# Patient Record
Sex: Female | Born: 1955 | Race: White | Hispanic: No | Smoking: Former smoker
Health system: Southern US, Community
[De-identification: ages and names within clinical notes are randomized; demographics above are authoritative.]

## PROBLEM LIST (undated history)

## (undated) DIAGNOSIS — I10 Essential (primary) hypertension: Secondary | ICD-10-CM

## (undated) DIAGNOSIS — IMO0002 Reserved for concepts with insufficient information to code with codable children: Secondary | ICD-10-CM

## (undated) DIAGNOSIS — Z86718 Personal history of other venous thrombosis and embolism: Secondary | ICD-10-CM

## (undated) DIAGNOSIS — I341 Nonrheumatic mitral (valve) prolapse: Secondary | ICD-10-CM

## (undated) DIAGNOSIS — T7840XA Allergy, unspecified, initial encounter: Secondary | ICD-10-CM

## (undated) DIAGNOSIS — M1990 Unspecified osteoarthritis, unspecified site: Secondary | ICD-10-CM

## (undated) DIAGNOSIS — Z86010 Personal history of colonic polyps: Secondary | ICD-10-CM

## (undated) DIAGNOSIS — N979 Female infertility, unspecified: Secondary | ICD-10-CM

## (undated) DIAGNOSIS — R87619 Unspecified abnormal cytological findings in specimens from cervix uteri: Secondary | ICD-10-CM

## (undated) HISTORY — PX: TONSILECTOMY/ADENOIDECTOMY WITH MYRINGOTOMY: SHX6125

## (undated) HISTORY — DX: Personal history of colonic polyps: Z86.010

## (undated) HISTORY — DX: Allergy, unspecified, initial encounter: T78.40XA

## (undated) HISTORY — DX: Unspecified osteoarthritis, unspecified site: M19.90

## (undated) HISTORY — DX: Unspecified abnormal cytological findings in specimens from cervix uteri: R87.619

## (undated) HISTORY — DX: Nonrheumatic mitral (valve) prolapse: I34.1

## (undated) HISTORY — PX: HYSTEROSCOPY: SHX211

## (undated) HISTORY — DX: Personal history of other venous thrombosis and embolism: Z86.718

## (undated) HISTORY — DX: Female infertility, unspecified: N97.9

## (undated) HISTORY — DX: Reserved for concepts with insufficient information to code with codable children: IMO0002

---
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
## 2008-02-19 DIAGNOSIS — Z860101 Personal history of adenomatous and serrated colon polyps: Secondary | ICD-10-CM

## 2008-02-19 DIAGNOSIS — Z860100 Personal history of colon polyps, unspecified: Secondary | ICD-10-CM | POA: Insufficient documentation

## 2008-02-19 HISTORY — DX: Personal history of adenomatous and serrated colon polyps: Z86.0101

## 2008-02-19 HISTORY — DX: Personal history of colonic polyps: Z86.010

## 2008-09-06 DIAGNOSIS — J309 Allergic rhinitis, unspecified: Secondary | ICD-10-CM | POA: Insufficient documentation

## 2008-09-06 DIAGNOSIS — D126 Benign neoplasm of colon, unspecified: Secondary | ICD-10-CM | POA: Insufficient documentation

## 2008-10-01 DIAGNOSIS — M949 Disorder of cartilage, unspecified: Secondary | ICD-10-CM

## 2008-10-01 DIAGNOSIS — M899 Disorder of bone, unspecified: Secondary | ICD-10-CM | POA: Insufficient documentation

## 2009-08-28 DIAGNOSIS — B354 Tinea corporis: Secondary | ICD-10-CM | POA: Insufficient documentation

## 2009-08-28 DIAGNOSIS — B351 Tinea unguium: Secondary | ICD-10-CM

## 2012-02-24 ENCOUNTER — Encounter: Payer: Self-pay | Admitting: Obstetrics and Gynecology

## 2012-02-24 ENCOUNTER — Ambulatory Visit (INDEPENDENT_AMBULATORY_CARE_PROVIDER_SITE_OTHER): Payer: BC Managed Care – HMO | Admitting: Obstetrics and Gynecology

## 2012-02-24 VITALS — BP 120/70 | Resp 20 | Ht 64.0 in | Wt 146.0 lb

## 2012-02-24 DIAGNOSIS — R87610 Atypical squamous cells of undetermined significance on cytologic smear of cervix (ASC-US): Secondary | ICD-10-CM

## 2012-02-24 DIAGNOSIS — IMO0001 Reserved for inherently not codable concepts without codable children: Secondary | ICD-10-CM | POA: Insufficient documentation

## 2012-02-24 NOTE — Progress Notes (Signed)
No complaints  Pelvic exam: normal external genitalia, vulva, vagina, cervix, uterus and adnexa.  A/P ASCUS neg HPV - repeat pap today AEX and repeat pap in 

## 2012-02-24 NOTE — Patient Instructions (Signed)
No complaints  Filed Vitals:   02/24/12 1625  BP: 120/70  Resp: 20   Ext genitalia wnl No abnl discharge  AEX in Pap done today secondary to h/o ascus with neg HPV

## 2012-02-28 LAB — PAP IG W/ RFLX HPV ASCU

## 2012-08-21 ENCOUNTER — Ambulatory Visit: Payer: BC Managed Care – HMO | Admitting: Obstetrics and Gynecology

## 2012-08-22 ENCOUNTER — Ambulatory Visit (INDEPENDENT_AMBULATORY_CARE_PROVIDER_SITE_OTHER): Payer: BC Managed Care – HMO | Admitting: Obstetrics and Gynecology

## 2012-08-22 ENCOUNTER — Encounter: Payer: Self-pay | Admitting: Obstetrics and Gynecology

## 2012-08-22 ENCOUNTER — Ambulatory Visit (INDEPENDENT_AMBULATORY_CARE_PROVIDER_SITE_OTHER): Payer: BC Managed Care – HMO

## 2012-08-22 VITALS — BP 134/70 | HR 72 | Resp 16 | Ht 63.0 in | Wt 137.0 lb

## 2012-08-22 DIAGNOSIS — Z124 Encounter for screening for malignant neoplasm of cervix: Secondary | ICD-10-CM

## 2012-08-22 DIAGNOSIS — M899 Disorder of bone, unspecified: Secondary | ICD-10-CM

## 2012-08-22 DIAGNOSIS — M949 Disorder of cartilage, unspecified: Secondary | ICD-10-CM

## 2012-08-22 DIAGNOSIS — Z01419 Encounter for gynecological examination (general) (routine) without abnormal findings: Secondary | ICD-10-CM

## 2012-08-22 DIAGNOSIS — M8580 Other specified disorders of bone density and structure, unspecified site: Secondary | ICD-10-CM

## 2012-08-22 NOTE — Progress Notes (Signed)
Contraception Postmenopausal Last pap 12/16/2011 ASC-US Last Mammo 01/13/2012 WNL Last Colonoscopy 2010 WNL Last Dexa Scan 2009 T Score 1.4=Osteopenic Primary MD Zachery Dauer  Abuse at Home None  No complaints.  Filed Vitals:   08/22/12 0939  BP: 134/70  Pulse: 72  Resp: 16   ROS: noncontributory  Physical Examination: General appearance - alert, well appearing, and in no distress Neck - supple, no significant adenopathy Chest - clear to auscultation, no wheezes, rales or rhonchi, symmetric air entry Heart - normal rate and regular rhythm Abdomen - soft, nontender, nondistended, no masses or organomegaly Breasts - breasts appear normal, no suspicious masses, no skin or nipple changes or axillary nodes Pelvic - normal external genitalia, vulva, vagina, cervix, uterus and adnexa Back exam - no CVAT Extremities - no edema, redness or tenderness in the calves or thighs  A/P BDS today secondary to h/o osteopenia Pap today mammo

## 2012-08-23 LAB — PAP IG W/ RFLX HPV ASCU

## 2012-08-31 ENCOUNTER — Encounter: Payer: Self-pay | Admitting: Obstetrics and Gynecology

## 2012-08-31 DIAGNOSIS — R87619 Unspecified abnormal cytological findings in specimens from cervix uteri: Secondary | ICD-10-CM | POA: Insufficient documentation

## 2012-08-31 DIAGNOSIS — E6609 Other obesity due to excess calories: Secondary | ICD-10-CM | POA: Insufficient documentation

## 2012-08-31 DIAGNOSIS — E669 Obesity, unspecified: Secondary | ICD-10-CM | POA: Insufficient documentation

## 2012-10-06 ENCOUNTER — Encounter: Payer: Self-pay | Admitting: Obstetrics and Gynecology

## 2014-07-10 ENCOUNTER — Other Ambulatory Visit: Payer: Self-pay

## 2014-07-10 DIAGNOSIS — Z1231 Encounter for screening mammogram for malignant neoplasm of breast: Secondary | ICD-10-CM

## 2014-07-29 ENCOUNTER — Ambulatory Visit
Admission: RE | Admit: 2014-07-29 | Discharge: 2014-07-29 | Disposition: A | Discharge status: Home / Self Care | Payer: BC Managed Care – PPO | Source: Ambulatory Visit

## 2014-07-29 DIAGNOSIS — Z1231 Encounter for screening mammogram for malignant neoplasm of breast: Secondary | ICD-10-CM

## 2015-08-21 ENCOUNTER — Other Ambulatory Visit: Payer: Self-pay | Admitting: Internal Medicine

## 2015-08-21 ENCOUNTER — Ambulatory Visit
Admission: RE | Admit: 2015-08-21 | Discharge: 2015-08-21 | Disposition: A | Discharge status: Home / Self Care | Payer: Managed Care, Other (non HMO) | Source: Ambulatory Visit | Attending: Internal Medicine | Admitting: Internal Medicine

## 2015-08-21 DIAGNOSIS — M25551 Pain in right hip: Secondary | ICD-10-CM

## 2015-08-22 ENCOUNTER — Other Ambulatory Visit: Payer: Self-pay | Admitting: Internal Medicine

## 2015-08-22 DIAGNOSIS — R1031 Right lower quadrant pain: Secondary | ICD-10-CM

## 2015-08-26 ENCOUNTER — Ambulatory Visit (INDEPENDENT_AMBULATORY_CARE_PROVIDER_SITE_OTHER): Payer: Managed Care, Other (non HMO) | Admitting: Family Medicine

## 2015-08-26 ENCOUNTER — Other Ambulatory Visit (INDEPENDENT_AMBULATORY_CARE_PROVIDER_SITE_OTHER): Payer: Managed Care, Other (non HMO)

## 2015-08-26 ENCOUNTER — Encounter: Payer: Self-pay | Admitting: Family Medicine

## 2015-08-26 VITALS — BP 136/78 | HR 78 | Ht 63.0 in | Wt 155.0 lb

## 2015-08-26 DIAGNOSIS — M79675 Pain in left toe(s): Secondary | ICD-10-CM | POA: Insufficient documentation

## 2015-08-26 DIAGNOSIS — M216X2 Other acquired deformities of left foot: Secondary | ICD-10-CM | POA: Insufficient documentation

## 2015-08-26 DIAGNOSIS — M25572 Pain in left ankle and joints of left foot: Secondary | ICD-10-CM

## 2015-08-26 DIAGNOSIS — M205X2 Other deformities of toe(s) (acquired), left foot: Secondary | ICD-10-CM | POA: Insufficient documentation

## 2015-08-26 NOTE — Assessment & Plan Note (Signed)
I believe toe pain is secondary to more of a dry skin. Patient does have fungal infection of the nail but I do not see any soft tissue infection. Patient does have some mild hallux limitus that likely also contribute in. Breakdown of the transverse arch can give her trouble. Patient will try over-the-counter orthotics, icing, home exercises and will come back in 4 weeks for further evaluation and treatment.

## 2015-08-26 NOTE — Progress Notes (Signed)
Pre visit review using our clinic review tool, if applicable. No additional management support is needed unless otherwise documented below in the visit note. 

## 2015-08-26 NOTE — Patient Instructions (Addendum)
Good to see you.  Spenco orthotic "total support"  Hydrocortisone cream to top of toe 2-3 times a week Eucerin or aveeno 2 times daily The 1st toe try pennsaid twice daily If you want vicks vapor rub can maybe help the toenail Vitamin D 2000 IU daily Good shoes with rigid bottom.  Jalene Mullet, Merrell or New balance greater then 700 See me again in 4 weeks

## 2015-08-26 NOTE — Progress Notes (Signed)
  [name] [place], [place] [address] Phone: [phone] Subjective:    I'm seeing this patient by the request  of:  Delice Lesch, MD   CC: left first toe pain.   [id number] Faith Galvan is a 59 y.o. female coming in with complaint of left first toe pain. Been years and seen multiple providers, told she has a spur in the toe.  Worse with wearing the shoes and having pressure. Patient denies any numbness. Denies any swelling. Patient states though than it usually is this time year that seems to be worse. Does not stop her from any activities but does give her some discomfort. Patient has not tried any other modalities except wearing sandals whenever possible.  Past Medical History  Diagnosis Date  . Allergy PCN  . 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 Social History  Substance Use Topics  . Smoking status: Former Smoker    Quit date: 08/22/1994  . Smokeless tobacco: None  . Alcohol Use: No   Allergies  Allergen Reactions  . Penicillins    No family history on file.      Past medical history, social, surgical and family history all reviewed in electronic medical record.   Review of Systems: No headache, visual changes, nausea, vomiting, diarrhea, constipation, dizziness, abdominal pain, skin rash, fevers, chills, night sweats, weight loss, swollen lymph nodes, body aches, joint swelling, muscle aches, chest pain, shortness of breath, mood changes.   Objective Blood pressure 136/78, pulse 78, height 5\' 3"  (1.6 m), weight 155 lb (70.308 kg), SpO2 97 %.  General: No apparent distress alert and oriented x3 mood and affect normal, dressed appropriately.  HEENT: Pupils equal, extraocular movements intact  Respiratory: Patient's speak in full sentences and does not appear short of breath  Cardiovascular: No lower extremity edema, non tender, no erythema  Skin: Warm dry intact with no  signs of infection or rash on extremities or on axial skeleton.  Abdomen: Soft nontender  Neuro: Cranial nerves II through XII are intact, neurovascularly intact in all extremities with 2+ DTRs and 2+ pulses.  Lymph: No lymphadenopathy of posterior or anterior cervical chain or axillae bilaterally.  Gait normal with good balance and coordination.  MSK:  Non tender with full range of motion and good stability and symmetric strength and tone of shoulders, elbows, wrist, hip, knee and ankles bilaterally.  Foot exam shows patient does have some breakdown of the transverse arch of the left foot. Patient does have some hallux limitus of the left foot. Nontender on exam today. Patient does have fungal infection of the toenails. Neurovascular intact distally. Full range of motion of the ankle.  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     Impression and Recommendations:     This case required medical decision making of moderate complexity.

## 2015-08-28 ENCOUNTER — Other Ambulatory Visit: Payer: Managed Care, Other (non HMO)

## 2015-09-15 ENCOUNTER — Other Ambulatory Visit: Payer: Self-pay

## 2015-09-15 DIAGNOSIS — Z1231 Encounter for screening mammogram for malignant neoplasm of breast: Secondary | ICD-10-CM

## 2015-09-16 ENCOUNTER — Ambulatory Visit: Payer: Managed Care, Other (non HMO) | Admitting: Family Medicine

## 2015-10-03 ENCOUNTER — Ambulatory Visit
Admission: RE | Admit: 2015-10-03 | Discharge: 2015-10-03 | Disposition: A | Discharge status: Home / Self Care | Payer: Managed Care, Other (non HMO) | Source: Ambulatory Visit

## 2015-10-03 DIAGNOSIS — Z1231 Encounter for screening mammogram for malignant neoplasm of breast: Secondary | ICD-10-CM

## 2016-02-24 ENCOUNTER — Ambulatory Visit (INDEPENDENT_AMBULATORY_CARE_PROVIDER_SITE_OTHER): Payer: PRIVATE HEALTH INSURANCE | Admitting: Family Medicine

## 2016-02-24 ENCOUNTER — Encounter: Payer: Self-pay | Admitting: Family Medicine

## 2016-02-24 ENCOUNTER — Other Ambulatory Visit (INDEPENDENT_AMBULATORY_CARE_PROVIDER_SITE_OTHER): Payer: PRIVATE HEALTH INSURANCE

## 2016-02-24 VITALS — BP 124/80 | HR 76 | Ht 63.0 in | Wt 156.0 lb

## 2016-02-24 DIAGNOSIS — S91001A Unspecified open wound, right ankle, initial encounter: Secondary | ICD-10-CM

## 2016-02-24 DIAGNOSIS — IMO0002 Reserved for concepts with insufficient information to code with codable children: Secondary | ICD-10-CM | POA: Insufficient documentation

## 2016-02-24 DIAGNOSIS — M25579 Pain in unspecified ankle and joints of unspecified foot: Secondary | ICD-10-CM

## 2016-02-24 NOTE — Progress Notes (Signed)
[name] [place], [place] [address] Phone: [phone] Subjective:    I'm seeing this patient by the request  of:  Delice Lesch, MD   CC: foot pain  [id number] Faith Galvan is a 60 y.o. female coming in with complaint of foot pain. Patient did have a fall improved her foot. This occurred 3 days ago. Patient was walking and had an inversion twist of her ankle causing her to fall. Patient herself with her hand. Had significant pain in the lateral aspect of the foot and ankle immediately. Bruising occurred the next day. Able to bear weight the entire time but does have pain. States that even standing can cause her discomfort. Denies any numbness or tingling. States that she can sleep comfortably at night unless she puts pressure on the outside of her right ankle. Rates the severity of pain a 6 out of 10. Does do a lot of activity on her own including yard work and feels like she needs to do this on a regular basis.  Past Medical History  Diagnosis Date  . Allergy PCN  . 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 Social History  Substance Use Topics  . Smoking status: Former Smoker    Quit date: 08/22/1994  . Smokeless tobacco: None  . Alcohol Use: No   Allergies  Allergen Reactions  . Penicillins    No family history on file.no family history of rheumatological diseases      Past medical history, social, surgical and family history all reviewed in electronic medical record.   Review of Systems: No headache, visual changes, nausea, vomiting, diarrhea, constipation, dizziness, abdominal pain, skin rash, fevers, chills, night sweats, weight loss, swollen lymph nodes, body aches, joint swelling, muscle aches, chest pain, shortness of breath, mood changes.   Objective Blood pressure 124/80, pulse 76, height 5\' 3"  (1.6 m), weight 156 lb (70.761 kg), SpO2 98 %.  General: No apparent distress  alert and oriented x3 mood and affect normal, dressed appropriately.  HEENT: Pupils equal, extraocular movements intact  Respiratory: Patient's speak in full sentences and does not appear short of breath  Cardiovascular: No lower extremity edema, non tender, no erythema  Skin: Warm dry intact with no signs of infection or rash on extremities or on axial skeleton.  Abdomen: Soft nontender  Neuro: Cranial nerves II through XII are intact, neurovascularly intact in all extremities with 2+ DTRs and 2+ pulses.  Lymph: No lymphadenopathy of posterior or anterior cervical chain or axillae bilaterally.  Gait normal with good balance and coordination.  MSK:  Non tender with full range of motion and good stability and symmetric strength and tone of shoulders, elbows, wrist, hip, knees bilaterally.  Ankle:right Trace lateral swelling with bruising noted. Range of motion is full in all directions. Strength is 4/5 in all directions. Mild pain over the ATFL as well as the cuboid bone Talar dome nontender; No pain at base of 5th MT; No tenderness over cuboid; No tenderness over N spot or navicular prominence No tenderness on posterior aspects of lateral and medial malleolus No sign of peroneal tendon subluxations or tenderness to palpation Negative tarsal tunnel tinel's Able to walk 4 steps.  Limited muscular skeletal ultrasound was performed and interpreted by Hulan Saas, M  Limited muscular skeletal ultrasound showed patient did have what appears to be a very small avulsion fracture on the proximal aspect of the  cuboid bone at the insertion of the ATFL. Patient seems to be intact over the lateral malleolus. Mild hypoechoic changes in the surrounding area. No swelling of the ankle mortise. Peroneal tendons unremarkable. Fifth metatarsal unremarkable.  Impression: Avulsion fracture cuboid bone right foot    Impression and Recommendations:     This case required medical decision making of  moderate complexity.

## 2016-02-24 NOTE — Assessment & Plan Note (Signed)
Very small nondisplaced. I do not see any loosening of the ATFL and likely will do well. Patient was given a lace up ankle brace. We discussed home exercises, topical anti-inflammatories given, we discussed icing regimen. We discussed avoiding certain activities. Follow-up in 2-3 weeks to make sure that the bone is healing appropriately. Continue with vitamin D supplementation.

## 2016-02-24 NOTE — Patient Instructions (Addendum)
Good to see you again.  Ice 20 minutes 2 times daily. Usually after activity and before bed. pennsaid pinkie amount topically 2 times daily as needed.  Arnica lotion can help with the bruising OK to do things but not more than 30 minutes at a time for next week.  Elevate when sitting and then try to spell alphabets Avoid being barefoot for next week Wear brace with activity for next 3 weeks See em again in 3 weeks before [employer]

## 2016-02-24 NOTE — Progress Notes (Signed)
Pre visit review using our clinic review tool, if applicable. No additional management support is needed unless otherwise documented below in the visit note. 

## 2016-03-16 ENCOUNTER — Encounter: Payer: Self-pay | Admitting: Family Medicine

## 2016-03-16 ENCOUNTER — Other Ambulatory Visit: Payer: Self-pay

## 2016-03-16 ENCOUNTER — Ambulatory Visit (INDEPENDENT_AMBULATORY_CARE_PROVIDER_SITE_OTHER): Payer: PRIVATE HEALTH INSURANCE | Admitting: Family Medicine

## 2016-03-16 VITALS — BP 136/82 | HR 72 | Wt 157.0 lb

## 2016-03-16 DIAGNOSIS — S91001D Unspecified open wound, right ankle, subsequent encounter: Secondary | ICD-10-CM

## 2016-03-16 MED ORDER — VITAMIN D (ERGOCALCIFEROL) 1.25 MG (50000 UNIT) PO CAPS: 50000.0000 [IU] | ORAL_CAPSULE | ORAL | Status: DC

## 2016-03-16 NOTE — Progress Notes (Signed)
[name] [address] [place], [place] [address] Phone: [phone] Subjective:    I'm seeing this patient by the request  of:  Delice Lesch, MD   CC: foot pain f/u  [id number] Faith Galvan is a 60 y.o. female coming in with complaint of foot pain. Patient was found to have an avulsion fracture of the cuboid bone. Patient was to wear bone and increase activity slowly. Patient is leaving for her trip. Patient has been wearing a ankle brace on a fairly regular basis. Patient statesOverall she is feeling better. States that the swelling and the bruising did finally dissipate approximately a week ago. Patient denies any radiation of pain. Seems to be much more localized. States that the ankle pain is much better but still having some mild of the dorsal foot pain. Is taking her vitamin D regularly.  Past Medical History  Diagnosis Date  . Allergy PCN  . 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 Social History  Substance Use Topics  . Smoking status: Former Smoker    Quit date: 08/22/1994  . Smokeless tobacco: None  . Alcohol Use: No   Allergies  Allergen Reactions  . Penicillins    No family history on file.no family history of rheumatological diseases      Past medical history, social, surgical and family history all reviewed in electronic medical record.   Review of Systems: No headache, visual changes, nausea, vomiting, diarrhea, constipation, dizziness, abdominal pain, skin rash, fevers, chills, night sweats, weight loss, swollen lymph nodes, body aches, joint swelling, muscle aches, chest pain, shortness of breath, mood changes.   Objective Blood pressure 136/82, pulse 72, weight 157 lb (71.215 kg).  General: No apparent distress alert and oriented x3 mood and affect normal, dressed appropriately.  HEENT: Pupils equal, extraocular movements intact  Respiratory: Patient's speak in full sentences  and does not appear short of breath  Cardiovascular: No lower extremity edema, non tender, no erythema  Skin: Warm dry intact with no signs of infection or rash on extremities or on axial skeleton.  Abdomen: Soft nontender  Neuro: Cranial nerves II through XII are intact, neurovascularly intact in all extremities with 2+ DTRs and 2+ pulses.  Lymph: No lymphadenopathy of posterior or anterior cervical chain or axillae bilaterally.  Gait normal with good balance and coordination.  MSK:  Non tender with full range of motion and good stability and symmetric strength and tone of shoulders, elbows, wrist, hip, knees bilaterally.  Ankle:right No bruising or swelling noted Range of motion is full in all directions. Strength is 4/5 in all directions. Mild pain over the ATFL as well as the cuboid bone still present Talar dome nontender; No pain at base of 5th MT; No tenderness over cuboid; No tenderness over N spot or navicular prominence No tenderness on posterior aspects of lateral and medial malleolus No sign of peroneal tendon subluxations or tenderness to palpation Negative tarsal tunnel tinel's Able to walk 4 steps.  Limited muscular skeletal ultrasound was performed and interpreted by Hulan Saas, M  Limited muscular skeletal ultrasound showed patient did have what appears to be a very small avulsion fracture on the proximal aspect of the cuboid bone.  Does have what appears to be some mild callus formation. Would state that it is about 70% healed. Increasing Doppler flow in the area.  Impression: Avulsion fracture cuboid bone right foot with interval healing  Impression and Recommendations:     This case required medical decision making of moderate complexity.

## 2016-03-16 NOTE — Patient Instructions (Signed)
Good to see you  You are doing great  Lets change you vitamin D to once weekly for next 12 weeks While in [place] and walking wear brace and when back wear brace with exercise for another 3 weeks.  OK to go without the brace or use the compression if you would like otherwise See me again in 3-4 weeks to make sure the bone heals fully.

## 2016-03-16 NOTE — Assessment & Plan Note (Signed)
Encourage patient to start slowly decreasing the amount of wear of the ankle brace. We discussed the importance of the compression potentially. Patient also is going to do more of an icing. Patient come back again in 4 weeks for further evaluation and we will make sure the patient is healed properly. Once weekly vitamin D prescribed today to aid in healing.

## 2016-04-14 ENCOUNTER — Other Ambulatory Visit: Payer: Self-pay

## 2016-04-14 ENCOUNTER — Ambulatory Visit (INDEPENDENT_AMBULATORY_CARE_PROVIDER_SITE_OTHER): Payer: PRIVATE HEALTH INSURANCE | Admitting: Family Medicine

## 2016-04-14 ENCOUNTER — Encounter: Payer: Self-pay | Admitting: Family Medicine

## 2016-04-14 VITALS — BP 116/82 | HR 73 | Ht 63.0 in | Wt 156.0 lb

## 2016-04-14 DIAGNOSIS — S91001D Unspecified open wound, right ankle, subsequent encounter: Secondary | ICD-10-CM | POA: Diagnosis not present

## 2016-04-14 NOTE — Patient Instructions (Signed)
You are doing great ! Be proud of yourself Ice still can help Continue the ibuprofen when you need it  Wear the brace 2 more weeks with a lot of walking.  See me when you need me or if the toes ever get numb!

## 2016-04-14 NOTE — Progress Notes (Signed)
Pre visit review using our clinic review tool, if applicable. No additional management support is needed unless otherwise documented below in the visit note. 

## 2016-04-14 NOTE — Assessment & Plan Note (Signed)
Patient is doing well. We'll discontinue the once weekly vitamin D after the 12 weeks and go back to daily. We discussed icing regimen. Discussed what activities to avoid. Patient will monitor for any type of neuroma symptoms. Patient and will come back and see me again only on an as-needed basis.

## 2016-04-14 NOTE — Progress Notes (Signed)
  [name] [place], [place] [address] Phone: [phone] Subjective:    I'm seeing this patient by the request  of:  Delice Lesch, MD   CC: foot pain f/u  [id number] Faith Galvan is a 60 y.o. female coming in with complaint of foot pain. Patient was found to have an avulsion fracture of the cuboid bone. Patient was to wear bone and increase activity slowly.Patient states she is doing very well. Feels approximately 100% better. Walking at least 4 miles daily without any significant discomfort. Happy with the results.  Past Medical History  Diagnosis Date  . Allergy PCN  . 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 Social History  Substance Use Topics  . Smoking status: Former Smoker    Quit date: 08/22/1994  . Smokeless tobacco: None  . Alcohol Use: No   Allergies  Allergen Reactions  . Penicillins    No family history on file.no family history of rheumatological diseases      Past medical history, social, surgical and family history all reviewed in electronic medical record.   Review of Systems: No headache, visual changes, nausea, vomiting, diarrhea, constipation, dizziness, abdominal pain, skin rash, fevers, chills, night sweats, weight loss, swollen lymph nodes, body aches, joint swelling, muscle aches, chest pain, shortness of breath, mood changes.   Objective Blood pressure 116/82, pulse 73, height 5\' 3"  (1.6 m), weight 156 lb (70.761 kg), SpO2 97 %.  General: No apparent distress alert and oriented x3 mood and affect normal, dressed appropriately.  HEENT: Pupils equal, extraocular movements intact  Respiratory: Patient's speak in full sentences and does not appear short of breath  Cardiovascular: No lower extremity edema, non tender, no erythema  Skin: Warm dry intact with no signs of infection or rash on extremities or on axial skeleton.  Abdomen: Soft nontender  Neuro:  Cranial nerves II through XII are intact, neurovascularly intact in all extremities with 2+ DTRs and 2+ pulses.  Lymph: No lymphadenopathy of posterior or anterior cervical chain or axillae bilaterally.  Gait normal with good balance and coordination.  MSK:  Non tender with full range of motion and good stability and symmetric strength and tone of shoulders, elbows, wrist, hip, knees bilaterally.  Ankle:right No bruising or swelling noted Range of motion is full in all directions. Strength is 5/5 in all directions. Very minimal tenderness over the cuboid bone No pain at base of 5th MT; No tenderness over cuboid; No tenderness over N spot or navicular prominence No tenderness on posterior aspects of lateral and medial malleolus No sign of peroneal tendon subluxations or tenderness to palpation Negative tarsal tunnel tinel's Able to walk 4 steps.  Limited muscular skeletal ultrasound was performed and interpreted by Hulan Saas, M  Limited muscular skeletal ultrasound showed patient did have what appears to be mostly healed avulsion fracture with patient continuing to have what appears to be a possible bone island.  Impression: Avulsion fracture cuboid bone right foot with no cortical defect and likely fully healed with a overlying bone island.    Impression and Recommendations:     This case required medical decision making of moderate complexity.

## 2016-06-23 NOTE — Progress Notes (Signed)
[name] D.O. [facility] [address] [place], [place] [address] Phone: [phone] Subjective:    I'm seeing this patient by the request  of:  Purcell NailsOBERTS,ANGELA Y, MD   CC: foot pain f/u  [id number]:Subjective  Faith Galvan is a 60 y.o. female coming in with complaint of foot pain. Patient was found to have an avulsion fracture of the cuboid bone. Patient was seen greater than 2 months ago and was doing 100% better. Patient states she started a new exercise class and did switch to over-the-counter vitamin D supplementation. Patient states pain is started increasing in swelling. Patient states since the same but unfortunately has some mild pain in a different area more laterally and then ankle. Patient states that it seems to be accompanied with some swelling at night. Swelling goes away during the day. Patient states the pain is worse at night as well.  Past Medical History:  Diagnosis Date  . 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 Allergy PCN   No past surgical history on file. Social History  Substance Use Topics  . Smoking status: Former Smoker    Quit date: 08/22/1994  . Smokeless tobacco: Not on file  . Alcohol use No   Allergies  Allergen Reactions  . Penicillins    No family history on file.no family history of rheumatological diseases      Past medical history, social, surgical and family history all reviewed in electronic medical record.   Review of Systems: No headache, visual changes, nausea, vomiting, diarrhea, constipation, dizziness, abdominal pain, skin rash, fevers, chills, night sweats, weight loss, swollen lymph nodes, body aches, joint swelling, muscle aches, chest pain, shortness of breath, mood changes.   Objective  Blood pressure 138/88, pulse (!) 101, SpO2 96 %.  General: No apparent distress alert and oriented x3 mood and affect normal, dressed appropriately.  HEENT: Pupils equal, extraocular movements intact  Respiratory: Patient's  speak in full sentences and does not appear short of breath  Cardiovascular: No lower extremity edema, non tender, no erythema  Skin: Warm dry intact with no signs of infection or rash on extremities or on axial skeleton.  Abdomen: Soft nontender  Neuro: Cranial nerves II through XII are intact, neurovascularly intact in all extremities with 2+ DTRs and 2+ pulses.  Lymph: No lymphadenopathy of posterior or anterior cervical chain or axillae bilaterally.  Gait normal with good balance and coordination.  MSK:  Non tender with full range of motion and good stability and symmetric strength and tone of shoulders, elbows, wrist, hip, knees bilaterally.  Ankle:right No bruising or swelling noted Range of motion is full in all directions. Strength is 5/5 in all directions. Very minimal tenderness over the cuboid bone No pain at base of 5th MT; patient is tender again over the cuboid as well as over the ATFL negative anterior drawer test No tenderness over N spot or navicular prominence No tenderness on posterior aspects of lateral and medial malleolus No sign of peroneal tendon subluxations or tenderness to palpation Negative tarsal tunnel tinel's Able to walk 4 steps.  Limited muscular skeletal ultrasound was performed and interpreted by Judi SaaZachary M Eugenio Dollins  Limited muscular skeletal ultrasound showed patient did have what appears to be mostly healed avulsion fracture but does have thickening of the bone in this area that is new from previous exam. Mild hypoechoic changes. Patient also has injury to the ATFL.  Impression: recurrent injury to the avulsion cuboid bone. Patient also  need to ATFL injury    Impression and Recommendations:     This case required medical decision making of moderate complexity.

## 2016-06-24 ENCOUNTER — Encounter: Payer: Self-pay | Admitting: Family Medicine

## 2016-06-24 ENCOUNTER — Ambulatory Visit (INDEPENDENT_AMBULATORY_CARE_PROVIDER_SITE_OTHER): Payer: PRIVATE HEALTH INSURANCE | Admitting: Family Medicine

## 2016-06-24 ENCOUNTER — Other Ambulatory Visit: Payer: Self-pay

## 2016-06-24 VITALS — BP 138/88 | HR 101

## 2016-06-24 DIAGNOSIS — S93409A Sprain of unspecified ligament of unspecified ankle, initial encounter: Secondary | ICD-10-CM | POA: Insufficient documentation

## 2016-06-24 DIAGNOSIS — M25571 Pain in right ankle and joints of right foot: Secondary | ICD-10-CM | POA: Diagnosis not present

## 2016-06-24 DIAGNOSIS — S93401A Sprain of unspecified ligament of right ankle, initial encounter: Secondary | ICD-10-CM | POA: Diagnosis not present

## 2016-06-24 DIAGNOSIS — S91001D Unspecified open wound, right ankle, subsequent encounter: Secondary | ICD-10-CM | POA: Diagnosis not present

## 2016-06-24 MED ORDER — VITAMIN D (ERGOCALCIFEROL) 1.25 MG (50000 UNIT) PO CAPS: 50000.0000 [IU] | ORAL_CAPSULE | ORAL | 0 refills | Status: DC

## 2016-06-24 NOTE — Patient Instructions (Signed)
God to see you Ice at night before bed Wear brace daily for 2 weeks when outside then only with exercise for 6 weeks Once weekly vitamin D again.  Continue to be active but no running or jumping pennsaid pinkie amount topically 2 times daily as needed.  See me again in 6 weeks.

## 2016-06-24 NOTE — Assessment & Plan Note (Signed)
Does have what appears to be a new ankle sprain. We discussed icing regimen, home exercises and what activities to do. Patient come back and see me again in 6 weeks for further evaluation.

## 2016-06-24 NOTE — Assessment & Plan Note (Signed)
Appears to be a small recurrent injury.  Patient is going to go back to the brace is on the once weekly vitamin D supplementation the seem to be helpful. Discussed and we went her to decrease certain exercises. Patient will do and icing pedicle. Follow-up with me again in 6 weeks

## 2016-08-01 NOTE — Progress Notes (Signed)
  [facility] [address] [place], [place] [address] Phone: [phone] Subjective:    CC: foot pain f/u  [id number]  Faith Galvan is a 60 y.o. female coming in with complaint of foot pain. Patient was found to have an avulsion fracture of the cuboid bone. Patient had what appeared to be a recurrent avulsion fracture with a anterior tibial-fibular ligament injury. Patient was to do conservative therapy again with immobilization, icing, home exercises. Patient states Overall she seems to be doing better. Patient states it has some aching pain from time to time but nothing as severe as what it was. Does think that we are making improvement.   Past Medical History:  Diagnosis Date  . 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 Allergy PCN   No past surgical history on file. Social History  Substance Use Topics  . Smoking status: Former Smoker    Quit date: 08/22/1994  . Smokeless tobacco: Not on file  . Alcohol use No   Allergies  Allergen Reactions  . Penicillins    No family history on file.no family history of rheumatological diseases    Past medical history, social, surgical and family history all reviewed in electronic medical record.   Review of Systems: No headache, visual changes, nausea, vomiting, diarrhea, constipation, dizziness, abdominal pain, skin rash, fevers, chills, night sweats, weight loss, swollen lymph nodes, body aches, joint swelling, muscle aches, chest pain, shortness of breath, mood changes.   Objective  Blood pressure 130/82, pulse 75, weight 156 lb (70.8 kg), SpO2 97 %.  General: No apparent distress alert and oriented x3 mood and affect normal, dressed appropriately.  HEENT: Pupils equal, extraocular movements intact  Respiratory: Patient's speak in full sentences and does not appear short of breath  Cardiovascular: No lower extremity edema, non tender, no erythema  Skin: Warm dry intact with no signs of infection or  rash on extremities or on axial skeleton.  Abdomen: Soft nontender  Neuro: Cranial nerves II through XII are intact, neurovascularly intact in all extremities with 2+ DTRs and 2+ pulses.  Lymph: No lymphadenopathy of posterior or anterior cervical chain or axillae bilaterally.  Gait normal with good balance and coordination.  MSK:  Non tender with full range of motion and good stability and symmetric strength and tone of shoulders, elbows, wrist, hip, knees bilaterally.  Ankle:right No bruising or swelling noted Range of motion is full in all directions. Strength is 5/5 in all directions. Very minimal tenderness over the cuboid bone No pain at base of 5th MT; patient is tender again over the cuboid as well as over the ATFL negative anterior drawer test No tenderness over N spot or navicular prominence No tenderness on posterior aspects of lateral and medial malleolus No sign of peroneal tendon subluxations or tenderness to palpation Negative tarsal tunnel tinel's Able to walk 4 steps.  Limited muscular skeletal ultrasound was performed and interpreted by Faith Galvan  Limited muscular skeletal ultrasound showed patient did have what appears to be mostly healed avulsion fracture ATFL is intact.  Impression: Significant improvement with good callus formation   Impression and Recommendations:     This case required medical decision making of moderate complexity.

## 2016-08-02 ENCOUNTER — Encounter: Payer: Self-pay | Admitting: Family Medicine

## 2016-08-02 ENCOUNTER — Ambulatory Visit (INDEPENDENT_AMBULATORY_CARE_PROVIDER_SITE_OTHER): Payer: PRIVATE HEALTH INSURANCE | Admitting: Family Medicine

## 2016-08-02 DIAGNOSIS — S91001D Unspecified open wound, right ankle, subsequent encounter: Secondary | ICD-10-CM

## 2016-08-02 NOTE — Patient Instructions (Signed)
Good to see you  Ice is your friend Continue the high dose vitamin D another 6 weeks. At the end then switch to 4000 IU daily  Brace with exercise and yard work another month  No running or jumping but good to go otherwise See me again in 6 weeks if not perfect

## 2016-08-02 NOTE — Assessment & Plan Note (Signed)
Doing much better overall. Patient seems to be doing well. We discussed continuing with the brace with activity such as exercising Weeks. Wearing his last throughout the rest the week. Continue vitamin D for the next 6 weeks. She'll come back again at the end of 6 weeks to make sure that she is completely resolved. If continuing pain consider custom orthotics.

## 2016-08-16 ENCOUNTER — Encounter: Payer: Self-pay | Admitting: Obstetrics & Gynecology

## 2016-08-16 ENCOUNTER — Ambulatory Visit (INDEPENDENT_AMBULATORY_CARE_PROVIDER_SITE_OTHER): Payer: PRIVATE HEALTH INSURANCE | Admitting: Obstetrics & Gynecology

## 2016-08-16 VITALS — BP 122/74 | HR 78 | Resp 14 | Ht 63.25 in | Wt 154.0 lb

## 2016-08-16 DIAGNOSIS — Z0000 Encounter for general adult medical examination without abnormal findings: Secondary | ICD-10-CM

## 2016-08-16 DIAGNOSIS — Z01419 Encounter for gynecological examination (general) (routine) without abnormal findings: Secondary | ICD-10-CM | POA: Diagnosis not present

## 2016-08-16 DIAGNOSIS — Z205 Contact with and (suspected) exposure to viral hepatitis: Secondary | ICD-10-CM

## 2016-08-16 LAB — POCT URINALYSIS DIPSTICK
Bilirubin, UA: NEGATIVE
Glucose, UA: NEGATIVE
Ketones, UA: NEGATIVE
Leukocytes, UA: NEGATIVE
NITRITE UA: NEGATIVE
PH UA: 5
PROTEIN UA: NEGATIVE
RBC UA: NEGATIVE
UROBILINOGEN UA: NEGATIVE

## 2016-08-16 LAB — TSH: TSH: 1.26 mIU/L

## 2016-08-16 NOTE — Progress Notes (Addendum)
60 y.o. G1P0. Widowed Caucasian F here for annual exam/new patient.  Friend ofhers referred her here.  Pt has never been on HRT.  Pt reports she is having some hot flashes and night sweats.   Last pap 2016 with Dr. Mancel Bale.  H/o neg HR HPV 2013.    No LMP recorded. Patient is postmenopausal.          Sexually active: No.  The current method of family planning is post menopausal status.    Exercising: Yes.    yoga Smoker:  no  Health Maintenance: Pap:  10/16 at [facility] normal per patient History of abnormal Pap:  yes MMG:  10/06/15 BIRADS 1 negative  Colonoscopy:  2014 normal repeat 10 years.  With [facility]. BMD:   10/16 osteopenia  TDaP:  unsure Pneumonia vaccine(s):  never Zostavax: scheduled to get tomorrow  Hep C testing: will do this today Screening Labs: discuss with provider, has copy of recent labs with PCP, Hb today: PCP, Urine today: normal    reports that she quit smoking about 22 years ago. She does not have any smokeless tobacco history on file. She reports that she does not drink alcohol or use drugs.  Past Medical History:  Diagnosis Date  . 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 Allergy PCN    No past surgical history on file.  Current Outpatient Prescriptions  Medication Sig Dispense Refill  . Vitamin D, Ergocalciferol, (DRISDOL) 50000 units CAPS capsule Take 1 capsule (50,000 Units total) by mouth every 7 (seven) days. 12 capsule 0   No current facility-administered medications for this visit.    ROS:  Pertinent items are noted in HPI.  Otherwise, a comprehensive ROS was negative.  Exam:   Vitals:   08/16/16 0937  BP: 122/74  Pulse: 78  Resp: 14    General appearance: alert, cooperative and appears stated age Head: Normocephalic, without obvious abnormality, atraumatic Neck: no adenopathy, supple, symmetrical, trachea midline and thyroid normal to inspection and palpation Lungs: clear to auscultation bilaterally Breasts: normal  appearance, no masses or tenderness Heart: regular rate and rhythm Abdomen: soft, non-tender; bowel sounds normal; no masses,  no organomegaly Extremities: extremities normal, atraumatic, no cyanosis or edema Skin: Skin color, texture, turgor normal. No rashes or lesions Lymph nodes: Cervical, supraclavicular, and axillary nodes normal. No abnormal inguinal nodes palpated Neurologic: Grossly normal  Pelvic: External genitalia:  no lesions              Urethra:  normal appearing urethra with no masses, tenderness or lesions              Bartholins and Skenes: normal                 Vagina: normal appearing vagina with normal color and discharge, no lesions              Cervix: no lesions              Pap taken: No. Bimanual Exam:  Uterus:  normal size, contour, position, consistency, mobility, non-tender              Adnexa: normal adnexa and no mass, fullness, tenderness               Rectovaginal: Confirms               Anus:  normal sphincter tone, no lesions  Chaperone was present for exam.  A:  Well Woman  with normal exam PMP, no HRT Family hx of colon cancer in first cousin, pt with hx of high grade colon polyp.  Was on every three year colonoscopy follow-up.  Saw [facility] in 2014 and was advised to have follow up in 10 years.  Release of records will be signed.   Family hx of bladder cancer  P:   Mammogram guidelines reviewed.  3D discussed.  Pt has not been doing 3D due to cost Release for pap and HR HPV from Dr. Mancel Bale from 2016 pap smear On Vit D due to avulsion of right ankle.  Followed by Dr. Hulan Saas.  Release of records for last colonoscopies, vaccine records, pap and HPV testing and recent BMD. Hep C antibody and TSH obtained today return annually or prn  Colonoscopy report came 08/24/16.  Hyperplastic polyp removed 09/11/12.  Follow up due 5 years.  Last Tdap 03/03/2007.  Pap neg with neg HR HPV 09/20/15 BMD 04/11/15.  -1.3/-1.6 (spine/hip).  All records will  be scanned into EPIC.

## 2016-08-17 LAB — HEPATITIS C ANTIBODY: HCV AB: NEGATIVE

## 2016-09-01 ENCOUNTER — Other Ambulatory Visit: Payer: Self-pay | Admitting: Physical Medicine and Rehabilitation

## 2016-09-01 DIAGNOSIS — Z1231 Encounter for screening mammogram for malignant neoplasm of breast: Secondary | ICD-10-CM

## 2016-09-12 NOTE — Progress Notes (Signed)
  [facility] [address] [place], [place] [address] Phone: [phone] Subjective:   All information including history of present illness, past medical, surgical, family and social history as well as physical exam is current as of 09/12/16  CC: foot pain f/u  [id number]  Faith Galvan is a 60 y.o. female coming in with complaint of foot pain. Patient was found to have an avulsion fracture of the cuboid bone. Patient had what appeared to be a recurrent avulsion fracture with a anterior tibial-fibular ligament injury.  Patient was making significant improvement.Patient was to continue with bracing as well as vitamin D supplementation. Patient states The pain is improved overall. Still has some mild foot pain when walking long distances. Patient is wanting to work out on a more regular basis. Patient has gotten new shoes which has been helpful but is wondering if she needs other ones for working out. Patient denies any nighttime pain. Denies any swelling. Feels that the vitamin D has been helpful but is now going back to the daily vitamin D's.      Past Medical History:  Diagnosis Date  . 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 Allergy PCN   No past surgical history on file.      Social History  Substance Use Topics  . Smoking status: Former Smoker    Quit date: 08/22/1994  . Smokeless tobacco: Not on file  . Alcohol use No   Allergies  Allergen Reactions  . Penicillins    No family history on file.no family history of rheumatological diseases    Past medical history, social, surgical and family history all reviewed in electronic medical record.   Review of Systems: No headache, visual changes, nausea, vomiting, diarrhea, constipation, dizziness, abdominal pain, skin rash, fevers, chills, night sweats, weight loss, swollen lymph nodes, body aches, joint swelling, muscle aches, chest pain, shortness of breath, mood changes.    Objective  Blood pressure 130/82, pulse 75, weight 156 lb (70.8 kg), SpO2 97 %.  General: No apparent distress alert and oriented x3 mood and affect normal, dressed appropriately.  HEENT: Pupils equal, extraocular movements intact  Respiratory: Patient's speak in full sentences and does not appear short of breath  Cardiovascular: No lower extremity edema, non tender, no erythema  Skin: Warm dry intact with no signs of infection or rash on extremities or on axial skeleton.  Abdomen: Soft nontender  Neuro: Cranial nerves II through XII are intact, neurovascularly intact in all extremities with 2+ DTRs and 2+ pulses.  Lymph: No lymphadenopathy of posterior or anterior cervical chain or axillae bilaterally.  Gait normal with good balance and coordination.  MSK:  Non tender with full range of motion and good stability and symmetric strength and tone of shoulders, elbows, wrist, hip, knees bilaterally.  Ankle:right No bruising or swelling noted Range of motion is full in all directions. Strength is 5/5 in all directions. Nontender on exam  No pain at base of 5th MT;  No tenderness over N spot or navicular prominence No tenderness on posterior aspects of lateral and medial malleolus No sign of peroneal tendon subluxations or tenderness to palpation Negative tarsal tunnel tinel's Able to walk 4 steps.

## 2016-09-13 ENCOUNTER — Encounter: Payer: Self-pay | Admitting: Family Medicine

## 2016-09-13 ENCOUNTER — Ambulatory Visit (INDEPENDENT_AMBULATORY_CARE_PROVIDER_SITE_OTHER): Payer: PRIVATE HEALTH INSURANCE | Admitting: Family Medicine

## 2016-09-13 VITALS — BP 112/78 | HR 95 | Wt 153.0 lb

## 2016-09-13 DIAGNOSIS — M205X2 Other deformities of toe(s) (acquired), left foot: Secondary | ICD-10-CM | POA: Diagnosis not present

## 2016-09-13 DIAGNOSIS — M216X2 Other acquired deformities of left foot: Secondary | ICD-10-CM

## 2016-09-13 DIAGNOSIS — M79675 Pain in left toe(s): Secondary | ICD-10-CM | POA: Diagnosis not present

## 2016-09-13 MED ORDER — DOXYCYCLINE HYCLATE 100 MG PO TABS: 100.0000 mg | ORAL_TABLET | Freq: Two times a day (BID) | ORAL | 0 refills | Status: AC

## 2016-09-13 NOTE — Patient Instructions (Signed)
Great to see you  Ice if you need it Start the vitamin D 2000 IUdaily  We will get you in with Dr. Paulla Dolly for the toenail We will get you in orthotics Doxy 2 times a day for next 10 days See me again if you need me.

## 2016-09-13 NOTE — Assessment & Plan Note (Signed)
Patient's will do well in a orthotic as well.

## 2016-09-13 NOTE — Assessment & Plan Note (Signed)
Patient is going to need a more long-term benefit. I do feel custom orthotics is important in this individual who continues to be very active. Patient will be set up for this in the near future. We discussed icing regimen, home exercises and topical anti-inflammatories as needed. We'll continue to avoid significant time being barefoot.

## 2016-09-18 NOTE — Progress Notes (Signed)
  [name] [address] [place], [place] [address] Phone: [phone] Subjective:    CC: foot pain f/u  [id number]  Faith Galvan is a 60 y.o. female coming in with complaint of foot pain. Patient was found to have an avulsion fracture of the cuboid bone. Patient had what appeared to be a recurrent avulsion fracture with a anterior tibial-fibular ligament injury. Patient in follow-up was doing significantly better. It has been 4 weeks since previous visit. Patient is being fitted for custom orthotics today.      Past Medical History:  Diagnosis Date  . 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 Allergy PCN   No past surgical history on file.      Social History  Substance Use Topics  . Smoking status: Former Smoker    Quit date: 08/22/1994  . Smokeless tobacco: Not on file  . Alcohol use No       Allergies  Allergen Reactions  . Penicillins    No family history on file.no family history of rheumatological diseases    Past medical history, social, surgical and family history all reviewed in electronic medical record.   Review of Systems: No headache, visual changes, nausea, vomiting, diarrhea, constipation, dizziness, abdominal pain, skin rash, fevers, chills, night sweats, weight loss, swollen lymph nodes, body aches, joint swelling, muscle aches, chest pain, shortness of breath, mood changes.   There were no vitals taken for this visit.  Systems examined below as of 09/18/16 General: NAD A&O x3 mood, affect normal    Ankle:right No bruising or swelling noted Range of motion is full in all directions. Strength is 5/5 in all directions. Nontender on exam  No pain at base of 5th MT;  No tenderness over N spot or navicular prominence No tenderness on posterior aspects of lateral and medial malleolus No sign of peroneal tendon subluxations or tenderness to palpation Negative tarsal tunnel tinel's Able to walk 4  steps.  Procedure Note   Patient was fitted for a : Igli Comfort, standard, cushioned, semi-rigid orthotic. The orthotic was heated and afterward the patient patient seated position and molded The patient was positioned in subtalar neutral position and 10 degrees of ankle dorsiflexion in a weight bearing stance. After completion of molding, patient did have orthotic management The blank was ground to a stable position for weight bearing. Size: Women's 8-9 Base: Carbon fiber Additional Posting and Padding: Left medial posting The patient ambulated these, and they were very comfortable. I spent 45 minutes with this patient, greater than 50% was face-to-face time counseling regarding the below diagnosis.

## 2016-09-20 ENCOUNTER — Ambulatory Visit (INDEPENDENT_AMBULATORY_CARE_PROVIDER_SITE_OTHER): Payer: PRIVATE HEALTH INSURANCE | Admitting: Family Medicine

## 2016-09-20 DIAGNOSIS — M216X2 Other acquired deformities of left foot: Secondary | ICD-10-CM

## 2016-09-20 DIAGNOSIS — M205X2 Other deformities of toe(s) (acquired), left foot: Secondary | ICD-10-CM

## 2016-09-20 NOTE — Assessment & Plan Note (Signed)
Placed in custom orthotics today. We discussed with patient at great length. Patient will slowly start increasing the wear over the course of time. Continuing the medications. Follow-up in 2-3 weeks for further evaluation. Adjustments may be necessary.

## 2016-09-29 ENCOUNTER — Ambulatory Visit (INDEPENDENT_AMBULATORY_CARE_PROVIDER_SITE_OTHER): Payer: PRIVATE HEALTH INSURANCE | Admitting: Podiatry

## 2016-09-29 VITALS — BP 124/87 | HR 87

## 2016-09-29 DIAGNOSIS — L600 Ingrowing nail: Secondary | ICD-10-CM

## 2016-09-29 NOTE — Progress Notes (Signed)
   Subjective:    Patient ID: Faith Galvan, female    DOB: Dec 18, 1955, 60 y.o.   MRN: [id number]  HPI    Review of Systems  Musculoskeletal: Positive for arthralgias.  All other systems reviewed and are negative.      Objective:   Physical Exam        Assessment & Plan:

## 2016-09-30 NOTE — Progress Notes (Signed)
Subjective:     Patient ID: Faith Galvan, female   DOB: [birth year], 60 y.o.   MRN: [id number]  HPI patient was concerned about discoloration of the left big nail and wanted to make sure there was no damage   Review of Systems  All other systems reviewed and are negative.      Objective:   Physical Exam  Constitutional: She is oriented to person, place, and time.  Cardiovascular: Intact distal pulses.   Musculoskeletal: Normal range of motion.  Neurological: She is oriented to person, place, and time.  Skin: Skin is warm.  Nursing note and vitals reviewed.  neurovascular status intact muscle strength adequate range of motion within normal limits with patient found have incurvated left hallux nail with discoloration of the localized nature     Assessment:     Probable damage to the nailbed with discoloration which is related to structure    Plan:     Educated patient and at this point I recommended just watching the condition and if symptoms were to get worse we'll need to consider more aggressive treatment plan

## 2016-10-04 ENCOUNTER — Ambulatory Visit (INDEPENDENT_AMBULATORY_CARE_PROVIDER_SITE_OTHER): Payer: PRIVATE HEALTH INSURANCE | Admitting: Podiatry

## 2016-10-04 DIAGNOSIS — S93401A Sprain of unspecified ligament of right ankle, initial encounter: Secondary | ICD-10-CM | POA: Diagnosis not present

## 2016-10-04 DIAGNOSIS — L600 Ingrowing nail: Secondary | ICD-10-CM | POA: Diagnosis not present

## 2016-10-04 DIAGNOSIS — M779 Enthesopathy, unspecified: Secondary | ICD-10-CM

## 2016-10-04 MED ORDER — TRIAMCINOLONE ACETONIDE 10 MG/ML IJ SUSP
10.0000 mg | Freq: Once | INTRAMUSCULAR | Status: AC
  Administered 2016-10-04: 10 mg

## 2016-10-04 NOTE — Progress Notes (Signed)
Subjective:     Patient ID: Faith Galvan, female   DOB: [birth year], 60 y.o.   MRN: [id number]  HPI patient presents stating I'm very happy with how its doing and I'm excited to get it fixed permanently. Also she has trouble with the right ankle and has had this for about 8 months and it becomes inflamed especially when getting up in the morning   Review of Systems     Objective:   Physical Exam Neurovascular status intact with damaged left hallux nail that is loose and has been chronically irritated and patient cannot trim herself. Right sinus tarsi is quite inflamed but I did not note restriction of motion or indications of bone injury    Assessment:     Damage left hallux nail with possibility for acute sinus tarsitis right with inflammation of the joint surface    Plan:     H&P conditions reviewed and recommended nail removal left explaining risk. Patient wants surgery and today infiltrated 60 Milligan times like Marcaine mixture removed hallux nail exposed matrix and applied phenol 5 applications 30 seconds followed by alcohol lavage and sterile dressing. I then discussed the right and I do think that sinus tarsi injection might be of great benefit and I did review chronic sprained ankle and today I went ahead and I injected the right sinus tarsi 3 mg Kenalog 5 mg Xylocaine and instructed on physical therapy
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
## 2016-10-11 ENCOUNTER — Telehealth: Payer: Self-pay | Admitting: *Deleted

## 2016-10-11 ENCOUNTER — Ambulatory Visit
Admission: RE | Admit: 2016-10-11 | Discharge: 2016-10-11 | Disposition: A | Discharge status: Home / Self Care | Payer: PRIVATE HEALTH INSURANCE | Source: Ambulatory Visit | Attending: Physical Medicine and Rehabilitation | Admitting: Physical Medicine and Rehabilitation

## 2016-10-11 ENCOUNTER — Other Ambulatory Visit: Payer: Self-pay | Admitting: Obstetrics & Gynecology

## 2016-10-11 DIAGNOSIS — Z1231 Encounter for screening mammogram for malignant neoplasm of breast: Secondary | ICD-10-CM

## 2016-10-11 NOTE — Telephone Encounter (Addendum)
Pt states the base of her toenail itches enough to wake her. I told pt to switch from the antibacterial soap soaks to 1/2 C epsom salt and 1 Qt warm water same instructions as the soap soaks, also switch to polysporin or Bacitracin ointment. Pt states she is doing the polysporin, but will switch. I told pt to call me Wednesday or Thursday. Pt states she is going out of town next week and will call me Wednesday.10/22/2016-Pt states she called previously and thought what she was doing to take care of her nail was causing an allergic reaction, now she has darken, red area and puffiness a the base of the toenail. I told pt she would benefit from an appt and transferred to schedulers. 10/25/2016-Gina - [facility] states pt has been written for Cephalexin and she has an penicillin allergy. Dr. Paulla Dolly states should be fine. Informed Gina of Dr. Mellody Drown statement.

## 2016-10-25 ENCOUNTER — Ambulatory Visit (INDEPENDENT_AMBULATORY_CARE_PROVIDER_SITE_OTHER): Payer: PRIVATE HEALTH INSURANCE | Admitting: Podiatry

## 2016-10-25 DIAGNOSIS — L03032 Cellulitis of left toe: Secondary | ICD-10-CM | POA: Diagnosis not present

## 2016-10-25 MED ORDER — CEPHALEXIN 500 MG PO CAPS: 500.0000 mg | ORAL_CAPSULE | Freq: Two times a day (BID) | ORAL | 0 refills | Status: DC

## 2016-10-26 NOTE — Progress Notes (Signed)
Subjective:     Patient ID: Faith Galvan, female   DOB: 1956-02-23, 60 y.o.   MRN: [id number]  HPI patient presents stating she has inflammation around the left big toenail that was removed and she just wanted to get it checked   Review of Systems     Objective:   Physical Exam Neurovascular status intact with mild redness in the proximal nail fold left and no proximal edema erythema drainage noted with no systemic signs of infection    Assessment:     Localized paronychia left hallux    Plan:     Explained if it were to remain or get worse we'll need to open it up and if she should develop any cellulitic event she is to go to the emergency room and today is partially measure advised on soaks and placed on cephalexin 500 mg twice a day

## 2016-12-09 ENCOUNTER — Ambulatory Visit (INDEPENDENT_AMBULATORY_CARE_PROVIDER_SITE_OTHER): Payer: PRIVATE HEALTH INSURANCE | Admitting: Podiatry

## 2016-12-09 ENCOUNTER — Ambulatory Visit (INDEPENDENT_AMBULATORY_CARE_PROVIDER_SITE_OTHER): Payer: PRIVATE HEALTH INSURANCE

## 2016-12-09 ENCOUNTER — Encounter: Payer: Self-pay | Admitting: Podiatry

## 2016-12-09 DIAGNOSIS — M79671 Pain in right foot: Secondary | ICD-10-CM

## 2016-12-09 DIAGNOSIS — M779 Enthesopathy, unspecified: Secondary | ICD-10-CM | POA: Diagnosis not present

## 2016-12-09 DIAGNOSIS — M7751 Other enthesopathy of right foot: Secondary | ICD-10-CM

## 2016-12-09 DIAGNOSIS — M778 Other enthesopathies, not elsewhere classified: Secondary | ICD-10-CM

## 2016-12-09 MED ORDER — TRIAMCINOLONE ACETONIDE 10 MG/ML IJ SUSP
10.0000 mg | Freq: Once | INTRAMUSCULAR | Status: AC
  Administered 2016-12-09: 10 mg

## 2016-12-10 NOTE — Progress Notes (Signed)
Subjective:     Patient ID: Faith Galvan, female   DOB: 08/13/1956, 61 y.o.   MRN: [id number]  HPI patient presents with pain in the right lateral foot and into the ankle stating that it has been present for a few weeks and there is been history of this   Review of Systems     Objective:   Physical Exam Neurovascular status intact with exquisite discomfort within the sinus tarsi right with inflammation fluid buildup and mild to moderate discomfort on the lateral side of the foot    Assessment:     Inflammatory capsulitis right sinus tarsi with inflammation fluid buildup    Plan:     Advised on topical and we will start her on topical anti-inflammatory and possibly with a neuropathic component and today injected the sinus tarsi right 3 mg Kenalog 5 mg Xylocaine

## 2017-02-03 ENCOUNTER — Encounter (INDEPENDENT_AMBULATORY_CARE_PROVIDER_SITE_OTHER): Payer: PRIVATE HEALTH INSURANCE | Admitting: Family Medicine

## 2017-02-22 ENCOUNTER — Ambulatory Visit (INDEPENDENT_AMBULATORY_CARE_PROVIDER_SITE_OTHER): Payer: PRIVATE HEALTH INSURANCE | Admitting: Family Medicine

## 2017-03-02 ENCOUNTER — Ambulatory Visit (INDEPENDENT_AMBULATORY_CARE_PROVIDER_SITE_OTHER): Payer: PRIVATE HEALTH INSURANCE | Admitting: Family Medicine

## 2017-05-11 ENCOUNTER — Encounter: Payer: Self-pay | Admitting: Podiatry

## 2017-05-11 ENCOUNTER — Ambulatory Visit (INDEPENDENT_AMBULATORY_CARE_PROVIDER_SITE_OTHER): Payer: PRIVATE HEALTH INSURANCE | Admitting: Podiatry

## 2017-05-11 DIAGNOSIS — M778 Other enthesopathies, not elsewhere classified: Secondary | ICD-10-CM

## 2017-05-11 DIAGNOSIS — M79671 Pain in right foot: Secondary | ICD-10-CM | POA: Diagnosis not present

## 2017-05-11 DIAGNOSIS — M779 Enthesopathy, unspecified: Principal | ICD-10-CM

## 2017-05-11 DIAGNOSIS — M7751 Other enthesopathy of right foot: Secondary | ICD-10-CM | POA: Diagnosis not present

## 2017-05-11 MED ORDER — TRIAMCINOLONE ACETONIDE 10 MG/ML IJ SUSP
10.0000 mg | Freq: Once | INTRAMUSCULAR | Status: AC
  Administered 2017-05-11: 10 mg

## 2017-05-11 MED ORDER — DICLOFENAC SODIUM 75 MG PO TBEC: 75.0000 mg | DELAYED_RELEASE_TABLET | Freq: Two times a day (BID) | ORAL | 2 refills | Status: DC
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
## 2017-08-19 ENCOUNTER — Other Ambulatory Visit (HOSPITAL_COMMUNITY)
Admission: RE | Admit: 2017-08-19 | Discharge: 2017-08-19 | Disposition: A | Discharge status: Home / Self Care | Payer: PRIVATE HEALTH INSURANCE | Source: Ambulatory Visit | Attending: Obstetrics & Gynecology | Admitting: Obstetrics & Gynecology

## 2017-08-19 ENCOUNTER — Ambulatory Visit (INDEPENDENT_AMBULATORY_CARE_PROVIDER_SITE_OTHER): Payer: PRIVATE HEALTH INSURANCE | Admitting: Obstetrics & Gynecology

## 2017-08-19 ENCOUNTER — Encounter: Payer: Self-pay | Admitting: Obstetrics & Gynecology

## 2017-08-19 VITALS — BP 144/90 | HR 68 | Resp 16 | Ht 62.75 in | Wt 161.0 lb

## 2017-08-19 DIAGNOSIS — Z23 Encounter for immunization: Secondary | ICD-10-CM | POA: Diagnosis not present

## 2017-08-19 DIAGNOSIS — Z01419 Encounter for gynecological examination (general) (routine) without abnormal findings: Secondary | ICD-10-CM | POA: Diagnosis not present

## 2017-08-19 DIAGNOSIS — Z124 Encounter for screening for malignant neoplasm of cervix: Secondary | ICD-10-CM | POA: Diagnosis not present

## 2017-08-19 DIAGNOSIS — Z1211 Encounter for screening for malignant neoplasm of colon: Secondary | ICD-10-CM | POA: Diagnosis not present

## 2017-08-19 NOTE — Progress Notes (Addendum)
61 y.o. G1P0 WidowedCaucasianF here for annual exam.  Denies vaginal bleeding.  Reports some anxiety about her health as has several friends.  Having some concerns about increased hot flashes/night sweats.  Does not really want to be on HRT nor do I recommend it.  Gabapentin and paxil discussed.  OTC options discussed as well.  Has lab work done with PCP.  Wants to ask questions about some values that weren't "perfectly normal".  Reviewed lab work with pt.  Agree with recommendations from Dr. Inda Merlin.   No LMP recorded. Patient is postmenopausal.          Sexually active: No.  The current method of family planning is post menopausal status.    Exercising: Yes.    cardio 2-3 x week Smoker:  no  Health Maintenance: Pap:  09/20/15 Neg. HR HPV:neg    08/22/12 ASCUS. HR HPV:neg  History of abnormal Pap:  Yes, ASCUS MMG:  10/11/16 BIRADS1:neg  Colonoscopy:  09/11/12 polyps. F/u 5 years  BMD:   04/11/15 Osteopenia  TDaP:  2008 Pneumonia vaccine(s):  No Zostavax:   2017 Hep C testing: 08/16/16 Neg Screening Labs: PCP   reports that she quit smoking about 23 years ago. She has never used smokeless tobacco. She reports that she drinks alcohol. She reports that she does not use drugs.  Past Medical History:  Diagnosis Date  . 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 Allergy PCN  . Heart murmur   . Infertility, female     Past Surgical History:  Procedure Laterality Date  . HYSTEROSCOPY    . TONSILECTOMY/ADENOIDECTOMY WITH MYRINGOTOMY      Current Outpatient Prescriptions  Medication Sig Dispense Refill  . Boswellia Serrata (BOSWELLIA PO) Take 450 mg by mouth.    . Calcium-Magnesium-Vitamin D 542-70-623 MG-MG-UNIT TB24 Take by mouth.    . Cetirizine HCl (KLS ALLER-TEC PO) Take 1 tablet by mouth.    Marland Kitchen ibuprofen (ADVIL,MOTRIN) 200 MG tablet Take 400 mg by mouth every 6 (six) hours as needed.    . Multiple Vitamins-Minerals (AIRBORNE PO) Take by mouth.    Salley Scarlet [facility]  Anti-Inflammatory Cream- Diclofenac 3%, Baclofen 2%, Lidocaine 2% Apply 1-2 grams to affected area 3-4 times daily Qty. 120 gm 3 refills     No current facility-administered medications for this visit.     Family History  Problem Relation Age of Onset  . Diabetes Mother   . Hypertension Mother   . Diabetes Father   . Hypertension Father   . Cancer Paternal Aunt        mouth cancer  . Cancer Paternal Uncle        mouth cancer  . Cancer Maternal Grandmother        mouth cancer  . Heart disease Paternal Grandfather   . Bladder Cancer Paternal Aunt 84          . Colon cancer Cousin 60    ROS:  Pertinent items are noted in HPI.  Otherwise, a comprehensive ROS was negative.  Exam:   BP (!) 144/90 (BP Location: Left Arm, Patient Position: Sitting, Cuff Size: Large)   Pulse 68   Resp 16   Ht 5' 2.75" (1.594 m)   Wt 161 lb (73 kg)   BMI 28.75 kg/m    Height: 5' 2.75" (159.4 cm)  Ht Readings from Last 3 Encounters:  08/19/17 5' 2.75" (1.594 m)  08/16/16 5' 3.25" (1.607 m)  04/14/16 5\' 3"  (  1.6 m)    General appearance: alert, cooperative and appears stated age Head: Normocephalic, without obvious abnormality, atraumatic Neck: no adenopathy, supple, symmetrical, trachea midline and thyroid normal to inspection and palpation Lungs: clear to auscultation bilaterally Breasts: normal appearance, no masses or tenderness Heart: regular rate and rhythm Abdomen: soft, non-tender; bowel sounds normal; no masses,  no organomegaly Extremities: extremities normal, atraumatic, no cyanosis or edema Skin: Skin color, texture, turgor normal. No rashes or lesions Lymph nodes: Cervical, supraclavicular, and axillary nodes normal. No abnormal inguinal nodes palpated Neurologic: Grossly normal  Pelvic: External genitalia:  no lesions              Urethra:  normal appearing urethra with no masses, tenderness or lesions              Bartholins and Skenes: normal                  Vagina: normal appearing vagina with normal color and discharge, no lesions              Cervix: no lesions              Pap taken: Yes.   Bimanual Exam:  Uterus:  normal size, contour, position, consistency, mobility, non-tender              Adnexa: normal adnexa and no mass, fullness, tenderness               Rectovaginal: Confirms               Anus:  normal sphincter tone, no lesions  Chaperone was present for exam.  A:  Well Woman with normal exam PMP, no HRT Family hx of colon cancer in first cousin and pt with hx of dysplastic rectal polyp 2009, hyperplastic polyp 2013.  Colonoscopy due.  Family hx of bladder cancer  P:   Mammogram guidelines reviewed.  3D discussed. pap smear obtained today.  Guidelines reviewed.  She is uncomfortable waiting another year for pap smear.  Pap only obtained today. Referral to Dr. Carlean Purl.  This will be third different GI.  Feel she should try and stay with one GI as her history is a little more complex. Tdap updated today Information about Shingrix vaccination given. return annually or prn

## 2017-08-19 NOTE — Patient Instructions (Addendum)
Referral to GI--Dr. Carlean Purl.    Faith Galvan, Faith Galvan.  New shingles vaccine.  Shingrix.  If you want this, please let me know and I will write the prescription/order for you.  Black cohosh or Estroven. Gabapentin or Paxil.

## 2017-08-22 ENCOUNTER — Telehealth: Payer: Self-pay | Admitting: Internal Medicine

## 2017-08-22 LAB — CYTOLOGY - PAP: Diagnosis: NEGATIVE

## 2017-08-22 NOTE — Telephone Encounter (Signed)
Pt also requesting Dr.Gessner review her records. Records have been placed on Dr.Gessner's desk for review.

## 2017-09-06 ENCOUNTER — Encounter: Payer: Self-pay | Admitting: Internal Medicine

## 2017-09-06 NOTE — Telephone Encounter (Deleted)
Dr Carlean Purl reviewed prior Colonoscopy done Oct 2012 in [place], [place]. Faith Galvan is due for another Colonoscopy at this time. I called her and left her a voicemail to call back and schedule directly with a previsit.

## 2017-09-06 NOTE — Telephone Encounter (Signed)
Dr Carlean Purl reviewed prior Colonoscopy and agreed to see patient for a Direct Colonoscopy with a previsit. I have called Ms Rollo and left her a voicemail to call back and schedule.

## 2017-09-12 ENCOUNTER — Other Ambulatory Visit: Payer: Self-pay | Admitting: Obstetrics & Gynecology

## 2017-09-12 DIAGNOSIS — Z139 Encounter for screening, unspecified: Secondary | ICD-10-CM

## 2017-09-19 ENCOUNTER — Telehealth: Payer: Self-pay | Admitting: Podiatry

## 2017-09-19 NOTE — Telephone Encounter (Signed)
I am calling about medical records. I was would like to pick up the medical records for 11 May 2017 only. I would like to pick them up Wednesday or Thursday. If you need to speak to me or have any questions, please call me at [phone]. Thank you.

## 2017-09-19 NOTE — Telephone Encounter (Signed)
Tried calling pt back in regards to message left this morning about records request from 27 June visit. Left voicemail saying I can print the office visit note and burn x-rays to a CD and leave up front for her to pick up on Wednesday or Thursday. I stated there is a $5.00 fee for the CD of x-rays and when she comes in she would also need to sign a medical records release form authorizing [place] to release the records to her. I asked pt to call me back directly at [phone] to let me know if she would like the CD of x-rays from this visit and that if I am unable to answer to please leave a message and I would call her back.

## 2017-09-20 ENCOUNTER — Encounter: Payer: Self-pay | Admitting: Internal Medicine
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
## 2017-10-12 ENCOUNTER — Ambulatory Visit
Admission: RE | Admit: 2017-10-12 | Discharge: 2017-10-12 | Disposition: A | Discharge status: Home / Self Care | Payer: PRIVATE HEALTH INSURANCE | Source: Ambulatory Visit | Attending: Obstetrics & Gynecology | Admitting: Obstetrics & Gynecology

## 2017-10-12 DIAGNOSIS — Z139 Encounter for screening, unspecified: Secondary | ICD-10-CM

## 2017-11-18 ENCOUNTER — Ambulatory Visit (AMBULATORY_SURGERY_CENTER): Payer: Self-pay | Admitting: *Deleted

## 2017-11-18 ENCOUNTER — Other Ambulatory Visit: Payer: Self-pay

## 2017-11-18 VITALS — Ht 63.5 in | Wt 164.0 lb

## 2017-11-18 DIAGNOSIS — Z86010 Personal history of colonic polyps: Secondary | ICD-10-CM
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
## 2017-12-02 ENCOUNTER — Encounter: Payer: Self-pay | Admitting: Internal Medicine

## 2017-12-02 ENCOUNTER — Other Ambulatory Visit: Payer: Self-pay

## 2017-12-02 ENCOUNTER — Ambulatory Visit (AMBULATORY_SURGERY_CENTER): Payer: PRIVATE HEALTH INSURANCE | Admitting: Internal Medicine

## 2017-12-02 VITALS — BP 160/82 | HR 64 | Temp 97.8°F | Resp 16 | Ht 63.0 in | Wt 164.0 lb

## 2017-12-02 DIAGNOSIS — Z86010 Personal history of colonic polyps: Secondary | ICD-10-CM

## 2017-12-02 MED ORDER — SODIUM CHLORIDE 0.9 % IV SOLN: 500.0000 mL | INTRAVENOUS | Status: DC

## 2017-12-02 NOTE — Progress Notes (Signed)
Patient consents to observer being present for procedure.   

## 2017-12-02 NOTE — Op Note (Signed)
[place] Patient Name: Faith Galvan Procedure Date: 12/02/2017 9:12 AM MRN: [id number] Endoscopist: Gatha Mayer , MD Age: 62 Referring MD:  Date of Birth: 1956-08-30 Gender: Female Account #: [id number] Procedure:                Colonoscopy Indications:              Surveillance: Personal history of adenomatous                            polyps on last colonoscopy 5 years ago Medicines:                Propofol per Anesthesia, Monitored Anesthesia Care Procedure:                Pre-Anesthesia Assessment:                           - Prior to the procedure, a History and Physical                            was performed, and patient medications and                            allergies were reviewed. The patient's tolerance of                            previous anesthesia was also reviewed. The risks                            and benefits of the procedure and the sedation                            options and risks were discussed with the patient.                            All questions were answered, and informed consent                            was obtained. Prior Anticoagulants: The patient has                            taken no previous anticoagulant or antiplatelet                            agents. ASA Grade Assessment: II - A patient with                            mild systemic disease. After reviewing the risks                            and benefits, the patient was deemed in                            satisfactory condition to undergo the procedure.  After obtaining informed consent, the colonoscope                            was passed under direct vision. Throughout the                            procedure, the patient's blood pressure, pulse, and                            oxygen saturations were monitored continuously. The                            Colonoscope was introduced through the anus and   advanced to the the cecum, identified by                            appendiceal orifice and ileocecal valve. The                            colonoscopy was performed without difficulty. The                            patient tolerated the procedure well. The quality                            of the bowel preparation was excellent. The                            ileocecal valve, appendiceal orifice, and rectum                            were photographed. The bowel preparation used was                            Miralax. Scope In: 9:19:56 AM Scope Out: 9:35:38 AM Scope Withdrawal Time: 0 hours 11 minutes 57 seconds  Total Procedure Duration: 0 hours 15 minutes 42 seconds  Findings:                 The perianal and digital rectal examinations were                            normal. Pertinent negatives include normal prostate                            (size, shape, and consistency).                           Multiple diverticula were found in the sigmoid                            colon.                           A tattoo was seen in the rectum. A post-polypectomy  scar was found at the tattoo site.                           The exam was otherwise without abnormality on                            direct and retroflexion views. Complications:            No immediate complications. Estimated Blood Loss:     Estimated blood loss: none. Impression:               - Diverticulosis in the sigmoid colon.                           - A tattoo was seen in the rectum. A                            post-polypectomy scar was found at the tattoo site.                           - The examination was otherwise normal on direct                            and retroflexion views.                           - No specimens collected. Recommendation:           - Patient has a contact number available for                            emergencies. The signs and symptoms of potential                             delayed complications were discussed with the                            patient. Return to normal activities tomorrow.                            Written discharge instructions were provided to the                            patient.                           - Resume previous diet.                           - Continue present medications.                           - Await pathology results.                           - Repeat colonoscopy in 5 years for surveillance. Gatha Mayer, MD 12/02/2017 9:47:55 AM This report has been signed  electronically.

## 2017-12-02 NOTE — Progress Notes (Signed)
Pt's states no medical or surgical changes since previsit or office visit. maw 

## 2017-12-02 NOTE — Patient Instructions (Addendum)
No polyps today.  I am recommending a repeat colonoscopy in about 5 years based upon your history of the initial polyp.  I appreciate the opportunity to care for you. Gatha Mayer, MD, FACG   YOU HAD AN ENDOSCOPIC PROCEDURE TODAY AT [facility]:   Refer to the procedure report that was given to you for any specific questions about what was found during the examination.  If the procedure report does not answer your questions, please call your gastroenterologist to clarify.  If you requested that your care partner not be given the details of your procedure findings, then the procedure report has been included in a sealed envelope for you to review at your convenience later.  YOU SHOULD EXPECT: Some feelings of bloating in the abdomen. Passage of more gas than usual.  Walking can help get rid of the air that was put into your GI tract during the procedure and reduce the bloating. If you had a lower endoscopy (such as a colonoscopy or flexible sigmoidoscopy) you may notice spotting of blood in your stool or on the toilet paper. If you underwent a bowel prep for your procedure, you may not have a normal bowel movement for a few days.  Please Note:  You might notice some irritation and congestion in your nose or some drainage.  This is from the oxygen used during your procedure.  There is no need for concern and it should clear up in a day or so.  SYMPTOMS TO REPORT IMMEDIATELY:   Following lower endoscopy (colonoscopy or flexible sigmoidoscopy):  Excessive amounts of blood in the stool  Significant tenderness or worsening of abdominal pains  Swelling of the abdomen that is new, acute  Fever of 100F or higher   Following upper endoscopy (EGD)  Vomiting of blood or coffee ground material  New chest pain or pain under the shoulder blades  Painful or persistently difficult swallowing  New shortness of breath  Fever of 100F or higher  Black, tarry-looking  stools  For urgent or emergent issues, a gastroenterologist can be reached at any hour by calling [phone].   DIET:  We do recommend a small meal at first, but then you may proceed to your regular diet.  Drink plenty of fluids but you should avoid alcoholic beverages for 24 hours.  ACTIVITY:  You should plan to take it easy for the rest of today and you should NOT DRIVE or use heavy machinery until tomorrow (because of the sedation medicines used during the test).    FOLLOW UP: Our staff will call the number listed on your records the next business day following your procedure to check on you and address any questions or concerns that you may have regarding the information given to you following your procedure. If we do not reach you, we will leave a message.  However, if you are feeling well and you are not experiencing any problems, there is no need to return our call.  We will assume that you have returned to your regular daily activities without incident.  If any biopsies were taken you will be contacted by phone or by letter within the next 1-3 weeks.  Please call us at [phone] if you have not heard about the biopsies in 3 weeks.    SIGNATURES/CONFIDENTIALITY: You and/or your care partner have signed paperwork which will be entered into your electronic medical record.  These signatures attest to the fact that that the information above  on your After Visit Summary has been reviewed and is understood.  Full responsibility of the confidentiality of this discharge information lies with you and/or your care-partner.   Thank you for allowing [place] to provide your healthcare today.

## 2017-12-02 NOTE — Progress Notes (Signed)
To recovery, report to RN, VSS. 

## 2017-12-05 ENCOUNTER — Telehealth: Payer: Self-pay

## 2017-12-05 NOTE — Telephone Encounter (Signed)
  Follow up Call-  Call back number 12/02/2017  Post procedure Call Back phone  # [phone] cell  Permission to leave phone message Yes  Some recent data might be hidden     Patient questions:  Do you have a fever, pain , or abdominal swelling? No. Pain Score  0 *  Have you tolerated food without any problems? Yes.    Have you been able to return to your normal activities? No.  Do you have any questions about your discharge instructions: Diet   No. Medications  No. Follow up visit  No.  Do you have questions or concerns about your Care? No.  Actions: * If pain score is 4 or above: No action needed, pain <4.  No problems noted per pt. maw

## 2017-12-26 ENCOUNTER — Telehealth: Payer: Self-pay | Admitting: Obstetrics & Gynecology

## 2017-12-26 NOTE — Telephone Encounter (Signed)
Patient is asking to see Dr.Miller for breast pain.

## 2017-12-26 NOTE — Telephone Encounter (Signed)
Left message to call Kaitlyn at [phone]. 

## 2017-12-26 NOTE — Telephone Encounter (Signed)
Returning a call to Kaitlyn. °

## 2017-12-26 NOTE — Telephone Encounter (Signed)
Spoke with patient. Patient states that a couple months ago her cat jumped on her breast and it hurt for 1-2 hours. On Saturday 12/24/2017 she woke up with shooting breast pain. Denies any redness, swelling, masses, or nipple discharge. Requesting to be seen for further evaluation. Appointment scheduled for 12/28/2017 at 10:15 am with Dr.Miller. Patient is agreeable to date and time.  Routing to provider for final review. Patient agreeable to disposition. Will close encounter.

## 2017-12-28 ENCOUNTER — Encounter: Payer: Self-pay | Admitting: Obstetrics & Gynecology

## 2017-12-28 ENCOUNTER — Ambulatory Visit (INDEPENDENT_AMBULATORY_CARE_PROVIDER_SITE_OTHER): Payer: PRIVATE HEALTH INSURANCE | Admitting: Obstetrics & Gynecology

## 2017-12-28 ENCOUNTER — Other Ambulatory Visit: Payer: Self-pay

## 2017-12-28 VITALS — BP 152/98 | HR 70 | Resp 14 | Wt 161.5 lb

## 2017-12-28 DIAGNOSIS — N644 Mastodynia: Secondary | ICD-10-CM | POA: Diagnosis not present

## 2017-12-28 DIAGNOSIS — N6320 Unspecified lump in the left breast, unspecified quadrant: Secondary | ICD-10-CM
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
## 2017-12-28 NOTE — Progress Notes (Signed)
Scheduled patient while in office for left diagnostic mamm[facility] 01[facility]03[facility]2018 at 8:20 am. Patient is agreeable to date and time. Placed in mammogram hold.

## 2018-01-03 ENCOUNTER — Ambulatory Visit
Admission: RE | Admit: 2018-01-03 | Discharge: 2018-01-03 | Disposition: A | Discharge status: Home / Self Care | Payer: PRIVATE HEALTH INSURANCE | Source: Ambulatory Visit | Attending: Obstetrics & Gynecology | Admitting: Obstetrics & Gynecology

## 2018-01-03 DIAGNOSIS — N6320 Unspecified lump in the left breast, unspecified quadrant: Secondary | ICD-10-CM

## 2018-01-06 ENCOUNTER — Telehealth: Payer: Self-pay

## 2018-01-06 NOTE — Telephone Encounter (Signed)
Spoke with patient. Message given as seen below from [place]. Patient verbalizes understanding. 1 month recheck scheduled for 01/27/2018 at 10 am with Dr.Miller. Patient is agreeable to date and time. Removed from mammogram hold. Encounter closed.

## 2018-01-06 NOTE — Telephone Encounter (Signed)
-----   Message from Megan Salon, MD sent at 01/05/2018  5:11 PM EST ----- Please let pt know I did get the MMG report.  The radiologist also felt a mass in the location but the mammogram and ultrasound were negative.  Needs repeat breast exam in one month.  If still present, will need to refer to breast surgeon for possible excision.  Please make appt for one month.  Thanks.  Also can remove from MMG hold.

## 2018-01-27 ENCOUNTER — Encounter: Payer: Self-pay | Admitting: Obstetrics & Gynecology

## 2018-01-27 ENCOUNTER — Ambulatory Visit (INDEPENDENT_AMBULATORY_CARE_PROVIDER_SITE_OTHER): Payer: PRIVATE HEALTH INSURANCE | Admitting: Obstetrics & Gynecology

## 2018-01-27 VITALS — BP 118/80 | HR 84 | Resp 16 | Wt 168.0 lb

## 2018-01-27 DIAGNOSIS — N644 Mastodynia: Secondary | ICD-10-CM | POA: Diagnosis not present

## 2018-01-27 NOTE — Progress Notes (Signed)
GYNECOLOGY  VISIT  CC:   Follow up for breast tenderness  HPI: 62 y.o. G1P0 Widowed Caucasian female here for breast check.  Pt was seen 12/28/17 due to breast pain after cat jumped her breast.  She had some bruising and pain but this resolved.  Then she had pain again in February.  Diagnostic imaging was negative.  She is still having intermittent pain in the breast.    Does exercise regularly.  Doing upper body exercises.     GYNECOLOGIC HISTORY: No LMP recorded. Patient is postmenopausal. Contraception: post menopausal  Menopausal hormone therapy: none  Patient Active Problem List   Diagnosis Date Noted  . Sprain of ankle 06/24/2016  . Avulsion of right ankle 02/24/2016  . Toe pain, left 08/26/2015  . 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 Hallux limitus of left foot 08/26/2015  . Abnormal Pap smear, atypical squamous cells of undetermined sign (ASC-US) 02/24/2012  . Hx of adenomatous polyp of rectum 02/19/2008    Past Medical History:  Diagnosis Date  . 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 Allergy PCN  . Arthritis   . H/O blood clots    18 months old - blood clots in right leg  . Hx of adenomatous polyp of rectum 02/19/2008  . Infertility, female   . MVP (mitral valve prolapse)     Past Surgical History:  Procedure Laterality Date  . COLONOSCOPY  2009   multiple since  . HYSTEROSCOPY    . POLYPECTOMY    . TONSILECTOMY/ADENOIDECTOMY WITH MYRINGOTOMY      MEDS:   Current Outpatient Medications on File Prior to Visit  Medication Sig Dispense Refill  . acetaminophen (TYLENOL) 325 MG tablet Take 650 mg by mouth every 6 (six) hours as needed.    [name] Serrata (BOSWELLIA PO) Take 450 mg by mouth.    . Calcium-Magnesium-Vitamin D [id number] MG-MG-UNIT TB24 Take by mouth.    . calcium-vitamin D (OSCAL WITH D) 500-200 MG-UNIT tablet Take 1 tablet by mouth.    . folic acid (FOLVITE) 1 MG tablet Take 1 mg by mouth daily.    Marland Kitchen ibuprofen  (ADVIL,MOTRIN) 200 MG tablet Take 400 mg by mouth every 6 (six) hours as needed.    . Misc Natural Products (ESTROVEN ENERGY PO) Take 1 tablet by mouth daily.    . Multiple Vitamins-Minerals (AIRBORNE PO) Take by mouth.    Salley Scarlet FORMUL[facility]  Anti-Inflammatory Cream- Diclofenac 3%, Baclofen 2%, Lidocaine 2% Apply 1-2 grams to affected area 3-4 times daily Qty. 120 gm 3 refills    . VALSARTAN PO Take by mouth.     No current facility-administered medications on file prior to visit.     ALLERGIES: Penicillins  Family History  Problem Relation Age of Onset  . Diabetes Mother   . Hypertension Mother   . Diabetes Father   . Hypertension Father   . Cancer Paternal Aunt        mouth cancer  . Cancer Paternal Uncle        mouth cancer  . Cancer Maternal Grandmother        mouth cancer  . Heart disease Paternal Grandfather   . Bladder Cancer Paternal Aunt 23          . Colon cancer Cousin 63  . Colon polyps Neg Hx   . Esophageal cancer Neg Hx   . Stomach cancer Neg Hx   . Rectal  cancer Neg Hx   . Pancreatic cancer Neg Hx   . Prostate cancer Neg Hx     SH:  Widowed, non smoker  Review of Systems  All other systems reviewed and are negative.   PHYSICAL EXAMINATION:    BP 118/80 (BP Location: Right Arm, Patient Position: Sitting, Cuff Size: Large)   Pulse 84   Resp 16   Wt 168 lb (76.2 kg)   BMI 29.76 kg/m     Physical Exam  Constitutional: She appears well-developed and well-nourished.  HENT:  Head: Normocephalic and atraumatic.  Neck: No thyromegaly present.  Cardiovascular: Normal rate and regular rhythm.  Respiratory: Effort normal and breath sounds normal. Right breast exhibits no inverted nipple, no mass, no nipple discharge, no skin change and no tenderness. Left breast exhibits tenderness. Left breast exhibits no inverted nipple, no mass, no nipple discharge and no skin change. Breasts are symmetrical.    Lymphadenopathy:    She has no  cervical adenopathy.  Neurological: She is alert.  Psychiatric: She has a normal mood and affect.    Chaperone was present for exam.  Assessment: Left breast tenderness, improved  Plan: Call with any new pain Try 200 U Vit E Keep routine MMG screening scheduled for 11/19 D/w pt referral to breast surgeon.  We decided together to not proceed with this currently but can at anytime she desires.     ~15 minutes spent with patient >50% of time was in face to face discussion of above.

## 2018-03-15 ENCOUNTER — Encounter: Payer: Self-pay | Admitting: *Deleted

## 2018-03-15 ENCOUNTER — Other Ambulatory Visit: Payer: Self-pay | Admitting: Podiatry

## 2018-03-15 ENCOUNTER — Ambulatory Visit (INDEPENDENT_AMBULATORY_CARE_PROVIDER_SITE_OTHER): Payer: PRIVATE HEALTH INSURANCE

## 2018-03-15 ENCOUNTER — Encounter: Payer: Self-pay | Admitting: Podiatry

## 2018-03-15 ENCOUNTER — Ambulatory Visit (INDEPENDENT_AMBULATORY_CARE_PROVIDER_SITE_OTHER): Payer: PRIVATE HEALTH INSURANCE | Admitting: Podiatry

## 2018-03-15 DIAGNOSIS — E663 Overweight: Secondary | ICD-10-CM | POA: Insufficient documentation

## 2018-03-15 DIAGNOSIS — M779 Enthesopathy, unspecified: Secondary | ICD-10-CM

## 2018-03-15 DIAGNOSIS — M545 Low back pain, unspecified: Secondary | ICD-10-CM | POA: Insufficient documentation

## 2018-03-15 DIAGNOSIS — M778 Other enthesopathies, not elsewhere classified: Secondary | ICD-10-CM

## 2018-03-15 DIAGNOSIS — M25571 Pain in right ankle and joints of right foot: Secondary | ICD-10-CM

## 2018-03-15 DIAGNOSIS — M8580 Other specified disorders of bone density and structure, unspecified site: Secondary | ICD-10-CM | POA: Insufficient documentation

## 2018-03-15 DIAGNOSIS — N951 Menopausal and female climacteric states: Secondary | ICD-10-CM | POA: Insufficient documentation

## 2018-03-15 DIAGNOSIS — N390 Urinary tract infection, site not specified: Secondary | ICD-10-CM | POA: Insufficient documentation

## 2018-03-15 DIAGNOSIS — R6889 Other general symptoms and signs: Secondary | ICD-10-CM | POA: Insufficient documentation

## 2018-03-15 MED ORDER — DICLOFENAC SODIUM 75 MG PO TBEC: 75.0000 mg | DELAYED_RELEASE_TABLET | Freq: Two times a day (BID) | ORAL | 2 refills | Status: DC

## 2018-03-15 MED ORDER — TRIAMCINOLONE ACETONIDE 10 MG/ML IJ SUSP
10.0000 mg | Freq: Once | INTRAMUSCULAR | Status: AC
  Administered 2018-03-15: 10 mg

## 2018-03-15 NOTE — Progress Notes (Signed)
Subjective:   Patient ID: Faith Galvan, female   DOB: 62 y.o.   MRN: [id number]   HPI Patient presents stating she was doing very well but developed acute pain in her right ankle starting about 5 days ago.  Has now developed a secondary pain on top of the foot which is sore and makes it hard for her to walk or be ambulatory   ROS      Objective:  Physical Exam  Inflammatory capsulitis of the right sinus tarsi with fluid buildup and dorsal anterior tibial tendinitis right with inflammation noted     Assessment:  Inflammatory sinus tarsitis right with secondary tendinitis dorsal right     Plan:  H&P discussed both conditions separately.  I did go ahead and reviewed x-rays also and I injected the sinus tarsi right 3 mg Kenalog 5 mg Xylocaine and for the top of the foot I recommended ice therapy and placed on oral anti-inflammatory consisting of diclofenac.  If symptoms persist may require treatment of the dorsal tissue  X-rays indicate no indications of bony structural pathology with no indications of ankle impingement

## 2018-05-19 ENCOUNTER — Encounter (HOSPITAL_COMMUNITY): Payer: Self-pay | Admitting: Emergency Medicine

## 2018-05-19 ENCOUNTER — Ambulatory Visit (INDEPENDENT_AMBULATORY_CARE_PROVIDER_SITE_OTHER): Payer: PRIVATE HEALTH INSURANCE

## 2018-05-19 ENCOUNTER — Ambulatory Visit (HOSPITAL_COMMUNITY)
Admission: EM | Admit: 2018-05-19 | Discharge: 2018-05-19 | Disposition: A | Discharge status: Home / Self Care | Payer: PRIVATE HEALTH INSURANCE | Attending: Family Medicine | Admitting: Family Medicine

## 2018-05-19 ENCOUNTER — Ambulatory Visit (HOSPITAL_COMMUNITY): Payer: PRIVATE HEALTH INSURANCE

## 2018-05-19 DIAGNOSIS — M79671 Pain in right foot: Secondary | ICD-10-CM

## 2018-05-19 NOTE — ED Provider Notes (Signed)
[place]    CSN: [id number] Arrival date & time: 05/19/18  1932     History   Chief Complaint Chief Complaint  Patient presents with  . Foot Pain    HPI Faith Galvan is a 62 y.o. female.   62 year old female comes in for right foot pain after injury.  States a heavy object  fell onto the dorsal aspect of her right foot.  There is swelling and contusion, and she has been applying ice compress.  She denies numbness, tingling.  Denies painful weightbearing.  Has not taken anything for the symptoms.     Past Medical History:  Diagnosis Date  . 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 Allergy PCN  . Arthritis   . H/O blood clots    18 months old - blood clots in right leg  . Hx of adenomatous polyp of rectum 02/19/2008  . Infertility, female   . MVP (mitral valve prolapse)     Patient Active Problem List   Diagnosis Date Noted  . Overweight with body mass index (BMI) 25.0-29.9 03/15/2018  . Osteopenia 03/15/2018  . Intolerant of heat 03/15/2018  . Low back pain 03/15/2018  . Menopausal symptom 03/15/2018  . Urinary tract infectious disease 03/15/2018  . Sprain of ankle 06/24/2016  . Avulsion of right ankle 02/24/2016  . Toe pain, left 08/26/2015  . 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 Hallux limitus of left foot 08/26/2015  . Abnormal Pap smear of cervix 08/31/2012  . Abnormal Pap smear, atypical squamous cells of undetermined sign (ASC-US) 02/24/2012  . Onychomycosis due to dermatophyte 08/28/2009  . Disorder of bone and cartilage 10/01/2008  . Allergic rhinitis 09/06/2008  . Benign neoplasm of colon 09/06/2008  . Hx of adenomatous polyp of rectum 02/19/2008    Past Surgical History:  Procedure Laterality Date  . COLONOSCOPY  2009   multiple since  . HYSTEROSCOPY    . POLYPECTOMY    . TONSILECTOMY/ADENOIDECTOMY WITH MYRINGOTOMY      OB History    Gravida  1   Para  0   Term      Preterm      AB      Living    0     SAB      TAB      Ectopic      Multiple      Live Births               Home Medications    Prior to Admission medications   Medication Sig Start Date End Date Taking? Authorizing Provider  acetaminophen (TYLENOL) 325 MG tablet Take 650 mg by mouth every 6 (six) hours as needed.    [provider]  Boswellia Serrata (BOSWELLIA PO) Take 450 mg by mouth.    [provider]  Calcium-Magnesium-Vitamin D 127-51-700 MG-MG-UNIT TB24 Take by mouth.    [provider]  calcium-vitamin D (OSCAL WITH D) 500-200 MG-UNIT tablet Take 1 tablet by mouth.    [provider]  diclofenac (VOLTAREN) 75 MG EC tablet Take 1 tablet (75 mg total) by mouth 2 (two) times daily. 03/15/18   Wallene Huh, DPM  folic acid (FOLVITE) 1 MG tablet Take 1 mg by mouth daily.    [provider]  ibuprofen (ADVIL,MOTRIN) 200 MG tablet Take 400 mg by mouth every 6 (six) hours as needed.    [provider]  Misc  Natural Products (ESTROVEN ENERGY PO) Take 1 tablet by mouth daily.    [provider]  Multiple Vitamins-Minerals (AIRBORNE PO) Take by mouth.    [provider]  [facility] [place]  Anti-Inflammatory Cream- Diclofenac 3%, Baclofen 2%, Lidocaine 2% Apply 1-2 grams to affected area 3-4 times daily Qty. 120 gm 3 refills    [provider]  VALSARTAN PO Take by mouth.    [provider]  valsartan-hydrochlorothiazide (DIOVAN-HCT) 80-12.5 MG tablet  02/20/18   [provider]    Family History Family History  Problem Relation Age of Onset  . Diabetes Mother   . Hypertension Mother   . Diabetes Father   . Hypertension Father   . Cancer Paternal Aunt        mouth cancer  . Cancer Paternal Uncle        mouth cancer  . Cancer Maternal Grandmother        mouth cancer  . Heart disease Paternal Grandfather   . Bladder Cancer Paternal Aunt 19          . Colon cancer Cousin 9  . Colon  polyps Neg Hx   . Esophageal cancer Neg Hx   . Stomach cancer Neg Hx   . Rectal cancer Neg Hx   . Pancreatic cancer Neg Hx   . Prostate cancer Neg Hx     Social History Social History   Tobacco Use  . Smoking status: Former Smoker    Types: Cigarettes    Last attempt to quit: 08/22/1994    Years since quitting: 23.7  . Smokeless tobacco: Never Used  Substance Use Topics  . Alcohol use: Yes    Comment: 2-3 per week   . Drug use: No     Allergies   Penicillins   Review of Systems Review of Systems  Reason unable to perform ROS: See HPI as above.     Physical Exam Triage Vital Signs ED Triage Vitals [05/19/18 1938]  Enc Vitals Group     BP (!) 153/90     Pulse Rate 78     Resp 16     Temp (!) 97.1 F (36.2 C)     Temp src      SpO2 96 %     Weight      Height      Head Circumference      Peak Flow      Pain Score      Pain Loc      Pain Edu?      Excl. in [place]?    No data found.  Updated Vital Signs BP (!) 153/90   Pulse 78   Temp (!) 97.1 F (36.2 C)   Resp 16   SpO2 96%   Physical Exam  Constitutional: She is oriented to person, place, and time. She appears well-developed and well-nourished. No distress.  HENT:  Head: Normocephalic and atraumatic.  Eyes: Pupils are equal, round, and reactive to light. Conjunctivae are normal.  Musculoskeletal:  Swelling and contusion to the dorsal aspect of the right foot still 2nd-5th MTPs.  Tenderness to palpation around that region.  Full range of motion of ankle and toes.  Strength deferred.  Sensation intact and equal bilaterally.  Pedal pulse 2+ and equal bilaterally.  Cap refill less than 2 seconds.  Neurological: She is alert and oriented to person, place, and time.  Skin: She is not diaphoretic.     UC Treatments / Results  Labs (all labs ordered are listed, but only abnormal results are displayed) Labs Reviewed - No data to display  EKG None  Radiology Dg Foot Complete Right  Result Date:  05/19/2018 CLINICAL DATA:  The patient dropped a 10 pound object onto the right foot today with onset of pain. Initial encounter. EXAM: RIGHT FOOT COMPLETE - 3+ VIEW COMPARISON:  Plain films right foot 12/09/2016. FINDINGS: No acute bony or joint abnormality is identified. 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 Soft tissues are unremarkable. IMPRESSION: No acute abnormality. Electronically Signed   By: Inge Rise M.D.   On: 05/19/2018 20:15    Procedures Procedures (including critical care time)  Medications Ordered in UC Medications - No data to display  Initial Impression / Assessment and Plan / UC Course  I have reviewed the triage vital signs and the nursing notes.  Pertinent labs & imaging results that were available during my care of the patient were reviewed by me and considered in my medical decision making (see chart for details).    X-ray negative for fracture or dislocation.  NSAIDs, ice compress, elevation, Ace wrap during activity.  Return precautions given.  Patient expresses understanding and agrees to plan.  Final Clinical Impressions(s) / UC Diagnoses   Final diagnoses:  Foot pain, right    ED Prescriptions    None       Arturo Morton 05/19/18 2043

## 2018-05-19 NOTE — ED Notes (Signed)
Pt dropped marble block on her foot

## 2018-05-19 NOTE — Discharge Instructions (Signed)
Xray negative for fracture or dislocation. You can take your diclofenac for pain and swelling. Continue ice compress, elevation, ace wrap during activity. Follow up with podiatry if symptoms not improving or worsens.

## 2018-08-29 ENCOUNTER — Other Ambulatory Visit: Payer: Self-pay | Admitting: Obstetrics & Gynecology

## 2018-08-29 DIAGNOSIS — Z1231 Encounter for screening mammogram for malignant neoplasm of breast: Secondary | ICD-10-CM

## 2018-09-22 ENCOUNTER — Ambulatory Visit: Payer: PRIVATE HEALTH INSURANCE | Admitting: Obstetrics & Gynecology

## 2018-09-22 ENCOUNTER — Encounter: Payer: Self-pay | Admitting: Obstetrics & Gynecology

## 2018-09-22 ENCOUNTER — Ambulatory Visit (INDEPENDENT_AMBULATORY_CARE_PROVIDER_SITE_OTHER): Payer: PRIVATE HEALTH INSURANCE | Admitting: Obstetrics & Gynecology

## 2018-09-22 ENCOUNTER — Other Ambulatory Visit: Payer: Self-pay

## 2018-09-22 ENCOUNTER — Other Ambulatory Visit (HOSPITAL_COMMUNITY)
Admission: RE | Admit: 2018-09-22 | Discharge: 2018-09-22 | Disposition: A | Discharge status: Home / Self Care | Payer: PRIVATE HEALTH INSURANCE | Source: Ambulatory Visit | Attending: Obstetrics & Gynecology | Admitting: Obstetrics & Gynecology

## 2018-09-22 VITALS — BP 128/82 | HR 80 | Resp 16 | Ht 63.0 in | Wt 182.4 lb

## 2018-09-22 DIAGNOSIS — Z0000 Encounter for general adult medical examination without abnormal findings: Secondary | ICD-10-CM | POA: Diagnosis not present

## 2018-09-22 DIAGNOSIS — Z124 Encounter for screening for malignant neoplasm of cervix: Secondary | ICD-10-CM | POA: Insufficient documentation

## 2018-09-22 DIAGNOSIS — Z01419 Encounter for gynecological examination (general) (routine) without abnormal findings: Secondary | ICD-10-CM

## 2018-09-22 LAB — POCT URINALYSIS DIPSTICK
BILIRUBIN UA: NEGATIVE
Glucose, UA: NEGATIVE
KETONES UA: NEGATIVE
Leukocytes, UA: NEGATIVE
NITRITE UA: NEGATIVE
PH UA: 6 (ref 5.0–8.0)
Protein, UA: NEGATIVE
RBC UA: NEGATIVE
UROBILINOGEN UA: 0.2 U/dL

## 2018-09-22 NOTE — Progress Notes (Signed)
[facility] scheduled at [facility] on 10-13-18 at 1000. Detailed message left for patient with appointment details and telephone number and address of clinic. Advised patient would need to contact the [facility] if would like to do 3D MMG as it is $55 OOP charge if insurance does not cover. Also advised patient to contact the [facility] for release of 2018 MMG report for comparison. Advised patient to return call if additional questions.

## 2018-09-22 NOTE — Progress Notes (Signed)
62 y.o. G1P0 Widowed White or Caucasian female here for annual exam.  Doing well.  Denies vaginal bleeding.  Having some breast pain but this is much better than earlier this year.  Did have imaging in February.    Has been having right foot issues this year.  Keeps accidentally injuring it.    Denies vaginal bleeding.    No LMP recorded. Patient is postmenopausal.          Sexually active: No.  The current method of family planning is post menopausal status.    Exercising: Yes.    walking Smoker:  no  Health Maintenance: Pap:  08/19/17 neg  History of abnormal Pap:  Yes, ASCUS 2013 MMG:  01/03/18 [place] Left BIRADS1:Neg. Has appt 10/16/18 Colonoscopy:  12/02/17 f/u 5 years. BMD:   2016  TDaP:  2018 Pneumonia vaccine(s):  n/a Shingrix:   Zostavax Hep C testing: 08/16/16 Neg  Screening Labs: here today    reports that she quit smoking about 24 years ago. Her smoking use included cigarettes. She has never used smokeless tobacco. She reports that she drinks about 5.0 - 7.0 standard drinks of alcohol per week. She reports that she does not use drugs.  Past Medical History:  Diagnosis Date  . 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 Allergy PCN  . Arthritis   . H/O blood clots    18 months old - blood clots in right leg  . Hx of adenomatous polyp of rectum 02/19/2008  . Infertility, female   . MVP (mitral valve prolapse)     Past Surgical History:  Procedure Laterality Date  . HYSTEROSCOPY    . TONSILECTOMY/ADENOIDECTOMY WITH MYRINGOTOMY      Current Outpatient Medications  Medication Sig Dispense Refill  . Boswellia Serrata (BOSWELLIA PO) Take 450 mg by mouth.    . calcium-vitamin D (OSCAL WITH D) 500-200 MG-UNIT tablet Take 1 tablet by mouth.    Marland Kitchen ibuprofen (ADVIL,MOTRIN) 200 MG tablet Take 400 mg by mouth every 6 (six) hours as needed.    . Multiple Vitamins-Minerals (AIRBORNE PO) Take by mouth.    Salley Scarlet [facility]  Anti-Inflammatory Cream- Diclofenac 3%,  Baclofen 2%, Lidocaine 2% Apply 1-2 grams to affected area 3-4 times daily Qty. 120 gm 3 refills    . valsartan-hydrochlorothiazide (DIOVAN-HCT) 80-12.5 MG tablet Take 0.5 tablets by mouth daily.   2   No current facility-administered medications for this visit.     Family History  Problem Relation Age of Onset  . Diabetes Mother   . Hypertension Mother   . Diabetes Father   . Hypertension Father   . Cancer Paternal Aunt        mouth cancer  . Cancer Paternal Uncle        mouth cancer  . Cancer Maternal Grandmother        mouth cancer  . Heart disease Paternal Grandfather   . Bladder Cancer Paternal Aunt 54          . Colon cancer Cousin 21  . Colon polyps Neg Hx   . Esophageal cancer Neg Hx   . Stomach cancer Neg Hx   . Rectal cancer Neg Hx   . Pancreatic cancer Neg Hx   . Prostate cancer Neg Hx     Review of Systems  Constitutional:       Weight gain   Endocrine: Positive for heat intolerance.       Craving sweets  Musculoskeletal: Positive for myalgias.  Skin:       Itching   All other systems reviewed and are negative.   Exam:   BP 128/82 (BP Location: Right Arm, Patient Position: Sitting, Cuff Size: Large)   Pulse 80   Resp 16   Ht 5\' 3"  (1.6 m)   Wt 182 lb 6.4 oz (82.7 kg)   BMI 32.31 kg/m  Weight change: +21#  Height: 5\' 3"  (160 cm)  Ht Readings from Last 3 Encounters:  09/22/18 5\' 3"  (1.6 m)  12/02/17 5\' 3"  (1.6 m)  11/18/17 5' 3.5" (1.613 m)    General appearance: alert, cooperative and appears stated age Head: Normocephalic, without obvious abnormality, atraumatic Neck: no adenopathy, supple, symmetrical, trachea midline and thyroid normal to inspection and palpation Lungs: clear to auscultation bilaterally Breasts: normal appearance, no masses or tenderness Heart: regular rate and rhythm Abdomen: soft, non-tender; bowel sounds normal; no masses,  no organomegaly Extremities: extremities normal, atraumatic, no cyanosis or edema Skin: Skin  color, texture, turgor normal. No rashes or lesions Lymph nodes: Cervical, supraclavicular, and axillary nodes normal. No abnormal inguinal nodes palpated Neurologic: Grossly normal   Pelvic: External genitalia:  no lesions              Urethra:  normal appearing urethra with no masses, tenderness or lesions              Bartholins and Skenes: normal                 Vagina: normal appearing vagina with normal color and discharge, no lesions              Cervix: no lesions              Pap taken: No. Bimanual Exam:  Uterus:  normal size, contour, position, consistency, mobility, non-tender              Adnexa: normal adnexa and no mass, fullness, tenderness               Rectovaginal: Confirms               Anus:  normal sphincter tone, no lesions  Chaperone was present for exam.  A:  Well Woman with normal exam PMP, no HRT Family hx of colon cancer in first cousin and personal hx of dysplastic rectal polyp 2009, hyperplastic polyp 2013 Family hx of bladder cancer  P:   Mammogram guidelines reviewed.  Doing 3D.   pap smear and HR HPV obtained today.  Pt's insurance is changing and is concerned about costs for next year.  Updated today. Colonoscopy will be due in 5 years Lab work updated today:  CBC, CMP, Lipids, TSH, Vit D D/w pt shingrix.  Has done Zostavax but declines shingrix due to cost. return annually or prn

## 2018-09-23 LAB — COMPREHENSIVE METABOLIC PANEL
A/G RATIO: 2.4 — AB (ref 1.2–2.2)
ALT: 25 IU/L (ref 0–32)
AST: 15 IU/L (ref 0–40)
Albumin: 4.6 g/dL (ref 3.6–4.8)
Alkaline Phosphatase: 89 IU/L (ref 39–117)
BILIRUBIN TOTAL: 0.3 mg/dL (ref 0.0–1.2)
BUN/Creatinine Ratio: 17 (ref 12–28)
BUN: 13 mg/dL (ref 8–27)
CHLORIDE: 105 mmol/L (ref 96–106)
CO2: 22 mmol/L (ref 20–29)
Calcium: 10.1 mg/dL (ref 8.7–10.3)
Creatinine, Ser: 0.78 mg/dL (ref 0.57–1.00)
GFR calc non Af Amer: 82 mL/min/{1.73_m2} (ref >59)
GFR, EST AFRICAN AMERICAN: 94 mL/min/{1.73_m2} (ref >59)
GLOBULIN, TOTAL: 1.9 g/dL (ref 1.5–4.5)
Glucose: 98 mg/dL (ref 65–99)
POTASSIUM: 4.5 mmol/L (ref 3.5–5.2)
SODIUM: 141 mmol/L (ref 134–144)
Total Protein: 6.5 g/dL (ref 6.0–8.5)

## 2018-09-23 LAB — LIPID PANEL
CHOL/HDL RATIO: 3.1 ratio (ref 0.0–4.4)
Cholesterol, Total: 247 mg/dL — ABNORMAL HIGH (ref 100–199)
HDL: 80 mg/dL (ref >39)
LDL Calculated: 134 mg/dL — ABNORMAL HIGH (ref 0–99)
TRIGLYCERIDES: 163 mg/dL — AB (ref 0–149)
VLDL Cholesterol Cal: 33 mg/dL (ref 5–40)

## 2018-09-23 LAB — CBC
HEMATOCRIT: 38.8 % (ref 34.0–46.6)
Hemoglobin: 13.3 g/dL (ref 11.1–15.9)
MCH: 34.1 pg — AB (ref 26.6–33.0)
MCHC: 34.3 g/dL (ref 31.5–35.7)
MCV: 100 fL — AB (ref 79–97)
PLATELETS: 267 10*3/uL (ref 150–450)
RBC: 3.9 x10E6/uL (ref 3.77–5.28)
RDW: 11.7 % — AB (ref 12.3–15.4)
WBC: 6.7 10*3/uL (ref 3.4–10.8)

## 2018-09-23 LAB — VITAMIN D 25 HYDROXY (VIT D DEFICIENCY, FRACTURES): Vit D, 25-Hydroxy: 32.7 ng/mL (ref 30.0–100.0)

## 2018-09-23 LAB — TSH: TSH: 1.43 u[IU]/mL (ref 0.450–4.500)

## 2018-09-25 LAB — CYTOLOGY - PAP
DIAGNOSIS: NEGATIVE
HPV: NOT DETECTED

## 2018-09-28 ENCOUNTER — Telehealth: Payer: Self-pay | Admitting: *Deleted

## 2018-09-28 NOTE — Telephone Encounter (Signed)
Left voice mail to call back 

## 2018-09-28 NOTE — Telephone Encounter (Signed)
Patient notified

## 2018-09-28 NOTE — Telephone Encounter (Signed)
-----   Message from Megan Salon, MD sent at 09/27/2018  2:16 PM EST ----- Please let pt know her CBC, CMP, TSH, Vit D were all fine.  Cholesterol was elevated at 247 and LDLs were mildly elevated at 134.  triglycerides were 163.  American Heart Association calculation for cardiovascular disease done.  Her risk for cardiovascular disease based on this result and her blood pressure when she was in the office is 5.1% over the next 10 years.  Treatment is recommended when risk is 7.5%.  Ok to continue to monitor.    Pap neg with neg HR HPV.  02 recall.

## 2018-09-28 NOTE — Telephone Encounter (Signed)
Patient returned a call to [place].

## 2018-10-16 ENCOUNTER — Ambulatory Visit: Payer: PRIVATE HEALTH INSURANCE

## 2018-10-17 ENCOUNTER — Other Ambulatory Visit: Payer: Self-pay | Admitting: Surgery

## 2019-04-10 ENCOUNTER — Telehealth: Payer: Self-pay | Admitting: Obstetrics & Gynecology

## 2019-04-10 NOTE — Telephone Encounter (Signed)
Erroneous encounter

## 2019-06-27 ENCOUNTER — Ambulatory Visit (INDEPENDENT_AMBULATORY_CARE_PROVIDER_SITE_OTHER): Payer: No Typology Code available for payment source

## 2019-06-27 ENCOUNTER — Other Ambulatory Visit: Payer: Self-pay

## 2019-06-27 ENCOUNTER — Other Ambulatory Visit: Payer: Self-pay | Admitting: Podiatry

## 2019-06-27 ENCOUNTER — Encounter: Payer: Self-pay | Admitting: Podiatry

## 2019-06-27 ENCOUNTER — Ambulatory Visit (INDEPENDENT_AMBULATORY_CARE_PROVIDER_SITE_OTHER): Payer: No Typology Code available for payment source | Admitting: Podiatry

## 2019-06-27 VITALS — Temp 97.2°F

## 2019-06-27 DIAGNOSIS — M779 Enthesopathy, unspecified: Secondary | ICD-10-CM | POA: Diagnosis not present

## 2019-06-27 DIAGNOSIS — M778 Other enthesopathies, not elsewhere classified: Secondary | ICD-10-CM

## 2019-06-27 DIAGNOSIS — M79672 Pain in left foot: Secondary | ICD-10-CM

## 2019-06-27 MED ORDER — MELOXICAM 15 MG PO TABS: 15.0000 mg | ORAL_TABLET | Freq: Every day | ORAL | 0 refills | Status: DC

## 2019-06-27 NOTE — Progress Notes (Signed)
Subjective:   Patient ID: Faith Galvan, female   DOB: 63 y.o.   MRN: [id number]   HPI Patient states she is developed excruciating pain on top of her left foot and she does not remember injury.  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

## 2019-07-12 ENCOUNTER — Other Ambulatory Visit: Payer: Self-pay

## 2019-07-12 ENCOUNTER — Ambulatory Visit (INDEPENDENT_AMBULATORY_CARE_PROVIDER_SITE_OTHER): Payer: No Typology Code available for payment source | Admitting: Podiatry

## 2019-07-12 ENCOUNTER — Encounter: Payer: Self-pay | Admitting: Podiatry

## 2019-07-12 DIAGNOSIS — M779 Enthesopathy, unspecified: Secondary | ICD-10-CM | POA: Diagnosis not present

## 2019-07-12 DIAGNOSIS — M778 Other enthesopathies, not elsewhere classified: Secondary | ICD-10-CM

## 2019-07-13 NOTE — Progress Notes (Signed)
Subjective:   Patient ID: Faith Galvan, female   DOB: 63 y.o.   MRN: [id number]   HPI Patient states her left foot is quite improved with pain only upon exertion or periods of intense activity.  Patient states she is walking with a better heel toe gait   ROS      Objective:  Physical Exam  Plantar fascial symptomatology with inflammation noted that does appear to be improving with pain still upon deep palpation     Assessment:  Improving from plantar fascial symptomatology with conservative treatment     Plan:  Recommended the continuation of conservative care in the continuation of heat stretch supportive shoes and heel lifts as needed.  Patient is discharged and will be seen back on an as-needed basis

## 2019-07-24 ENCOUNTER — Other Ambulatory Visit: Payer: Self-pay | Admitting: Internal Medicine

## 2019-07-24 DIAGNOSIS — R1011 Right upper quadrant pain: Secondary | ICD-10-CM

## 2019-07-26 ENCOUNTER — Ambulatory Visit
Admission: RE | Admit: 2019-07-26 | Discharge: 2019-07-26 | Disposition: A | Discharge status: Home / Self Care | Payer: PRIVATE HEALTH INSURANCE | Source: Ambulatory Visit | Attending: Internal Medicine | Admitting: Internal Medicine

## 2019-07-26 DIAGNOSIS — R1011 Right upper quadrant pain: Secondary | ICD-10-CM

## 2019-07-30 ENCOUNTER — Other Ambulatory Visit (HOSPITAL_COMMUNITY): Payer: Self-pay | Admitting: Internal Medicine

## 2019-07-30 DIAGNOSIS — R1011 Right upper quadrant pain: Secondary | ICD-10-CM

## 2019-08-06 ENCOUNTER — Ambulatory Visit (HOSPITAL_COMMUNITY)
Admission: RE | Admit: 2019-08-06 | Discharge: 2019-08-06 | Disposition: A | Discharge status: Home / Self Care | Payer: No Typology Code available for payment source | Source: Ambulatory Visit | Attending: Internal Medicine | Admitting: Internal Medicine

## 2019-08-06 ENCOUNTER — Other Ambulatory Visit: Payer: Self-pay

## 2019-08-06 ENCOUNTER — Encounter (HOSPITAL_COMMUNITY): Payer: Self-pay

## 2019-08-06 DIAGNOSIS — R1011 Right upper quadrant pain: Secondary | ICD-10-CM

## 2019-08-07 ENCOUNTER — Encounter (HOSPITAL_COMMUNITY): Payer: No Typology Code available for payment source

## 2019-08-14 ENCOUNTER — Other Ambulatory Visit: Payer: Self-pay | Admitting: Surgery

## 2019-08-16 ENCOUNTER — Other Ambulatory Visit: Payer: Self-pay

## 2019-08-16 ENCOUNTER — Ambulatory Visit
Admission: RE | Admit: 2019-08-16 | Discharge: 2019-08-16 | Disposition: A | Discharge status: Home / Self Care | Payer: No Typology Code available for payment source | Source: Ambulatory Visit | Attending: Obstetrics & Gynecology | Admitting: Obstetrics & Gynecology

## 2019-08-16 DIAGNOSIS — Z1231 Encounter for screening mammogram for malignant neoplasm of breast: Secondary | ICD-10-CM

## 2019-08-17 ENCOUNTER — Other Ambulatory Visit: Payer: Self-pay | Admitting: Obstetrics & Gynecology

## 2019-08-17 DIAGNOSIS — R928 Other abnormal and inconclusive findings on diagnostic imaging of breast: Secondary | ICD-10-CM

## 2019-08-20 ENCOUNTER — Ambulatory Visit
Admission: RE | Admit: 2019-08-20 | Discharge: 2019-08-20 | Disposition: A | Discharge status: Home / Self Care | Payer: No Typology Code available for payment source | Source: Ambulatory Visit | Attending: Obstetrics & Gynecology | Admitting: Obstetrics & Gynecology

## 2019-08-20 ENCOUNTER — Other Ambulatory Visit: Payer: Self-pay | Admitting: Obstetrics & Gynecology

## 2019-08-20 ENCOUNTER — Other Ambulatory Visit: Payer: Self-pay

## 2019-08-20 DIAGNOSIS — N6310 Unspecified lump in the right breast, unspecified quadrant: Secondary | ICD-10-CM

## 2019-08-20 DIAGNOSIS — R928 Other abnormal and inconclusive findings on diagnostic imaging of breast: Secondary | ICD-10-CM

## 2019-08-31 NOTE — Pre-Procedure Instructions (Signed)
[id number] - [place], [place] [address] AT [address] [place] [address] Phone: [phone] Fax: [phone]      Your procedure is scheduled on 09-06-19  Report to [facility] "A" at 0530A.M., and check in at the Admitting office.  Call this number if you have problems the morning of surgery:  [phone]  Call [phone] if you have any questions prior to your surgery date Monday-Friday 8am-4pm    Remember:  Do not eat after midnight the night before your surgery  You may drink clear liquids until 0430AM the morning of your surgery.   Clear liquids allowed are: Water, Non-Citrus Juices (without pulp), Carbonated Beverages, Clear Tea, Black Coffee Only, and Gatorade   Take these medicines the morning of surgery with A SIP OF WATER : cetirizine (ZYRTEC) carboxymethylcellulose (LUBRICANT EYE DROPS)   7 days prior to surgery STOP taking any MOBIC, Aspirin (unless otherwise instructed by your surgeon), Aleve, Naproxen, Ibuprofen, Motrin, Advil, Goody's, BC's, all herbal medications, fish oil, and all vitamins.    The Morning of Surgery  Do not wear jewelry, make-up or nail polish.  Do not wear lotions, powders, or perfumes, or deodorant  Do not shave 48 hours prior to surgery.    Do not bring valuables to the hospital.  [facility] is not responsible for any belongings or valuables.  If you are a smoker, DO NOT Smoke 24 hours prior to surgery IF you wear a CPAP at night please bring your mask, tubing, and machine the morning of surgery   Remember that you must have someone to transport you home after your surgery, and remain with you for 24 hours if you are discharged the same day.   Contacts, glasses, hearing aids, dentures or bridgework may not be worn into surgery.    Leave your suitcase in the car.  After surgery it may be brought to your room.  For patients admitted to the hospital,  discharge time will be determined by your treatment team.  Patients discharged the day of surgery will not be allowed to drive home.    Special instructions:   - Preparing For Surgery  Before surgery, you can play an important role. Because skin is not sterile, your skin needs to be as free of germs as possible. You can reduce the number of germs on your skin by washing with CHG (chlorahexidine gluconate) Soap before surgery.  CHG is an antiseptic cleaner which kills germs and bonds with the skin to continue killing germs even after washing.    Oral Hygiene is also important to reduce your risk of infection.  Remember - BRUSH YOUR TEETH THE MORNING OF SURGERY WITH YOUR REGULAR TOOTHPASTE  Please do not use if you have an allergy to CHG or antibacterial soaps. If your skin becomes reddened/irritated stop using the CHG.  Do not shave (including legs and underarms) for at least 48 hours prior to first CHG shower. It is OK to shave your face.  Please follow these instructions carefully.   1. Shower the NIGHT BEFORE SURGERY and the MORNING OF SURGERY with CHG Soap.   2. If you chose to wash your hair, wash your hair first as usual with your normal shampoo.  3. After you shampoo, rinse your hair and body thoroughly to remove the shampoo.  4. Use CHG as you would any other liquid soap. You can apply CHG directly to the skin and wash gently  with a scrungie or a clean washcloth.   5. Apply the CHG Soap to your body ONLY FROM THE NECK DOWN.  Do not use on open wounds or open sores. Avoid contact with your eyes, ears, mouth and genitals (private parts). Wash Face and genitals (private parts)  with your normal soap.   6. Wash thoroughly, paying special attention to the area where your surgery will be performed.  7. Thoroughly rinse your body with warm water from the neck down.  8. DO NOT shower/wash with your normal soap after using and rinsing off the CHG Soap.  9. Pat yourself dry  with a CLEAN TOWEL.  10. Wear CLEAN PAJAMAS to bed the night before surgery, wear comfortable clothes the morning of surgery  11. Place CLEAN SHEETS on your bed the night of your first shower and DO NOT SLEEP WITH PETS.  Day of Surgery:  Do not apply any deodorants/lotions. Please shower the morning of surgery with the CHG soap  Please wear clean clothes to the hospital/surgery center.   Remember to brush your teeth WITH YOUR REGULAR TOOTHPASTE.  Please read over the  fact sheets that you were given.

## 2019-09-03 ENCOUNTER — Other Ambulatory Visit (HOSPITAL_COMMUNITY): Payer: No Typology Code available for payment source

## 2019-09-03 ENCOUNTER — Encounter (HOSPITAL_COMMUNITY)
Admission: RE | Admit: 2019-09-03 | Discharge: 2019-09-03 | Disposition: A | Discharge status: Home / Self Care | Payer: No Typology Code available for payment source | Source: Ambulatory Visit | Attending: Surgery | Admitting: Surgery

## 2019-09-03 ENCOUNTER — Encounter (HOSPITAL_COMMUNITY): Payer: Self-pay

## 2019-09-03 ENCOUNTER — Other Ambulatory Visit (HOSPITAL_COMMUNITY)
Admission: RE | Admit: 2019-09-03 | Discharge: 2019-09-03 | Disposition: A | Discharge status: Home / Self Care | Payer: No Typology Code available for payment source | Source: Ambulatory Visit | Attending: Surgery | Admitting: Surgery

## 2019-09-03 ENCOUNTER — Other Ambulatory Visit: Payer: Self-pay

## 2019-09-03 DIAGNOSIS — R9431 Abnormal electrocardiogram [ECG] [EKG]: Secondary | ICD-10-CM | POA: Diagnosis not present

## 2019-09-03 DIAGNOSIS — Z01812 Encounter for preprocedural laboratory examination: Secondary | ICD-10-CM | POA: Diagnosis present

## 2019-09-03 DIAGNOSIS — Z01810 Encounter for preprocedural cardiovascular examination: Secondary | ICD-10-CM | POA: Insufficient documentation

## 2019-09-03 HISTORY — DX: Essential (primary) hypertension: I10

## 2019-09-03 LAB — CBC
HCT: 40.4 % (ref 36.0–46.0)
Hemoglobin: 13.4 g/dL (ref 12.0–15.0)
MCH: 34.4 pg — ABNORMAL HIGH (ref 26.0–34.0)
MCHC: 33.2 g/dL (ref 30.0–36.0)
MCV: 103.6 fL — ABNORMAL HIGH (ref 80.0–100.0)
Platelets: 258 10*3/uL (ref 150–400)
RBC: 3.9 MIL/uL (ref 3.87–5.11)
RDW: 11.9 % (ref 11.5–15.5)
WBC: 6.1 10*3/uL (ref 4.0–10.5)
nRBC: 0 % (ref 0.0–0.2)

## 2019-09-03 LAB — BASIC METABOLIC PANEL
Anion gap: 10 (ref 5–15)
BUN: 18 mg/dL (ref 8–23)
CO2: 21 mmol/L — ABNORMAL LOW (ref 22–32)
Calcium: 9.8 mg/dL (ref 8.9–10.3)
Chloride: 107 mmol/L (ref 98–111)
Creatinine, Ser: 0.77 mg/dL (ref 0.44–1.00)
GFR calc Af Amer: >60 mL/min (ref >60)
GFR calc non Af Amer: >60 mL/min (ref >60)
Glucose, Bld: 109 mg/dL — ABNORMAL HIGH (ref 70–99)
Potassium: 4.4 mmol/L (ref 3.5–5.1)
Sodium: 138 mmol/L (ref 135–145)

## 2019-09-03 NOTE — Progress Notes (Signed)
PCP - dr. Josetta Huddle Cardiologist - denies  PPM/ICD - N/A Device Orders -N/A  Rep Notified - N/A  Chest x-ray - N/A EKG - 09/03/19 Stress Test - 25+ years ago ECHO - 30+ years ago Cardiac Cath - denies  Sleep Study - denies CPAP - denies  Blood Thinner Instructions:N/A Aspirin Instructions:N/A  ERAS Protcol -Clear liquids until 0430 DOS PRE-SURGERY Ensure or G2- N/A  COVID TEST- 09/03/19 after PAT appointment.    Anesthesia review: No  Patient denies shortness of breath, fever, cough and chest pain at PAT appointment   All instructions explained to the patient, with a verbal understanding of the material. Patient agrees to go over the instructions while at home for a better understanding. Patient also instructed to self quarantine after being tested for COVID-19. The opportunity to ask questions was provided.   Coronavirus Screening  Have you experienced the following symptoms:  Cough yes/no: No Fever (>100.87F)  yes/no: No Runny nose yes/no: No Sore throat yes/no: No Difficulty breathing/shortness of breath  yes/no: No  Have you or a family member traveled in the last 14 days and where? yes/no: No   If the patient indicates "YES" to the above questions, their PAT will be rescheduled to limit the exposure to others and, the surgeon will be notified. THE PATIENT WILL NEED TO BE ASYMPTOMATIC FOR 14 DAYS.   If the patient is not experiencing any of these symptoms, the PAT nurse will instruct them to NOT bring anyone with them to their appointment since they may have these symptoms or traveled as well.   Please remind your patients and families that hospital visitation restrictions are in effect and the importance of the restrictions.

## 2019-09-04 LAB — NOVEL CORONAVIRUS, NAA (HOSP ORDER, SEND-OUT TO REF LAB; TAT 18-24 HRS): SARS-CoV-2, NAA: NOT DETECTED

## 2019-09-04 NOTE — Progress Notes (Signed)
Anesthesia Chart Review:  Case: [id number] Date/Time: 09/06/19 0715   Procedure: LAPAROSCOPIC CHOLECYSTECTOMY, UMBILICIAL HERNIA REPAIR (N/A )   Anesthesia type: General   Pre-op diagnosis: BILIARY COLIC, UMBILICAL HERNIA   Location: [facility] OR ROOM 10 / [place] [facility]   Surgeon: Coralie Keens, MD      DISCUSSION: Patient is a 63 year old female scheduled for the above procedure.  History includes former smoker (quit 1995), HTN, MVP (diagnosed ~ 1987), "blood clot" RLE at age 28 months.   Last seen by PCP Dr. Inda Merlin on 07/30/19 for intermittent RUQ pain and nausea, consider gallbladder dysfunction. No EKGs or copy of old echo (done > 25 years ago) at that office.   She denies shortness of breath, cough, fever and chest pain at PAT RN visit. 09/03/2019 COVID-19 test was negative.  If no acute changes and I would anticipate that she could proceed as planned.    VS: BP 138/81   Pulse 71   Temp 36.9 C   Resp 20   Ht 5\' 4"  (1.626 m)   Wt 80.9 kg   SpO2 98%   BMI 30.62 kg/m   PROVIDERS: Josetta Huddle, MD is PCP [facility])   LABS: Labs reviewed: Acceptable for surgery. (all labs ordered are listed, but only abnormal results are displayed)  Labs Reviewed  BASIC METABOLIC PANEL - Abnormal; Notable for the following components:      Result Value   CO2 21 (*)    Glucose, Bld 109 (*)    All other components within normal limits  CBC - Abnormal; Notable for the following components:   MCV 103.6 (*)    MCH 34.4 (*)    All other components within normal limits     EKG: 09/03/19: Normal sinus rhythm Left axis deviation Anterior infarct , age undetermined Abnormal ECG Confirmed by Thompson Grayer ([id number]) on 09/03/2019 8:17:50 PM - No comparison tracing in CHL or Muse.   CV: Reported remote history of stress test and echo > 25 year ago.   Past Medical History:  Diagnosis Date  . 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 Allergy PCN  . Arthritis   . H/O blood clots    18 months  old - blood clots in right leg  . Hx of adenomatous polyp of rectum 02/19/2008  . Hypertension   . Infertility, female   . MVP (mitral valve prolapse)     Past Surgical History:  Procedure Laterality Date  . HYSTEROSCOPY    . TONSILECTOMY/ADENOIDECTOMY WITH MYRINGOTOMY    . TONSILLECTOMY      MEDICATIONS: . Boswellia Serrata (BOSWELLIA PO)  . CALCIUM-MAGNESIUM-VITAMIN D PO  . carboxymethylcellulose (LUBRICANT EYE DROPS) 0.5 % SOLN  . cetirizine (ZYRTEC) 10 MG tablet  . Cholecalciferol (VITAMIN D-3) 125 MCG (5000 UT) TABS  . Cyanocobalamin (VITAMIN B-12 PO)  . diclofenac sodium (VOLTAREN) 1 % GEL  . FOLIC ACID PO  . ibuprofen (ADVIL) 200 MG tablet  . meloxicam (MOBIC) 15 MG tablet  . Multiple Vitamins-Minerals (AIRBORNE PO)  . valsartan-hydrochlorothiazide (DIOVAN-HCT) 80-12.5 MG tablet   No current facility-administered medications for this encounter.     Myra Gianotti, PA-C Surgical Short Stay/Anesthesiology [facility] Phone [phone] [facility] Phone [phone] 09/04/2019 5:46 PM

## 2019-09-04 NOTE — Anesthesia Preprocedure Evaluation (Addendum)
Anesthesia Evaluation  Patient identified by MRN, date of birth, ID band Patient awake    Reviewed: Allergy & Precautions, NPO status , Patient's Chart, lab work & pertinent test results  Airway Mallampati: II  TM Distance: >3 FB     Dental  (+) Dental Advisory Given   Pulmonary neg pulmonary ROS, former smoker,    breath sounds clear to auscultation       Cardiovascular hypertension, Pt. on medications + DVT   Rhythm:Regular Rate:Normal  Hx MVP    Neuro/Psych negative neurological ROS     GI/Hepatic negative GI ROS, Neg liver ROS,   Endo/Other  negative endocrine ROS  Renal/GU negative Renal ROS     Musculoskeletal  (+) Arthritis ,   Abdominal   Peds  Hematology negative hematology ROS (+)   Anesthesia Other Findings   Reproductive/Obstetrics                           Lab Results  Component Value Date   WBC 6.1 09/03/2019   HGB 13.4 09/03/2019   HCT 40.4 09/03/2019   MCV 103.6 (H) 09/03/2019   PLT 258 09/03/2019   Lab Results  Component Value Date   CREATININE 0.77 09/03/2019   BUN 18 09/03/2019   NA 138 09/03/2019   K 4.4 09/03/2019   CL 107 09/03/2019   CO2 21 (L) 09/03/2019    Anesthesia Physical Anesthesia Plan  ASA: II  Anesthesia Plan: General   Post-op Pain Management:    Induction: Intravenous  PONV Risk Score and Plan: 3 and Dexamethasone, Ondansetron, Treatment may vary due to age or medical condition and Midazolam  Airway Management Planned: Oral ETT  Additional Equipment:   Intra-op Plan:   Post-operative Plan: Extubation in OR  Informed Consent: I have reviewed the patients History and Physical, chart, labs and discussed the procedure including the risks, benefits and alternatives for the proposed anesthesia with the patient or authorized representative who has indicated his/her understanding and acceptance.     Dental advisory given  Plan  Discussed with: CRNA  Anesthesia Plan Comments: ( )      Anesthesia Quick Evaluation

## 2019-09-05 NOTE — H&P (Signed)
Faith Galvan Documented: 08/14/2019 1:52 PM Location: Faith Galvan Patient #: [id number] DOB: [birth year] Widowed / Language: Faith Galvan / Race: White Female   History of Present Illness (Faith Aller A. Ninfa Linden MD; 08/14/2019 2:02 PM) The patient is a 63 year old female who presents with abdominal pain. This is a pleasant patient who I saw last fall with an umbilical hernia. She decided to hold on Galvan. She now is having biliary colic. This started about a month ago. She gets nausea, bloating, and right upper quadrant abdominal pain hurting through to the back after fatty meals. She hurts almost daily. It is mild to moderate in intensity. She describes it as sharp. She reports of the umbilical hernia has not changed. She underwent an ultrasound showing her to have either a gallbladder polyp or small stones. She could not have a HIDA scan secondary to contrast allergies. Bowel movements are normal.   Past Surgical History Faith Galvan, Faith Galvan; 08/14/2019 1:52 PM) Colon Polyp Removal - Colonoscopy  Oral Galvan   Diagnostic Studies History Faith Galvan, Faith Galvan; 08/14/2019 1:52 PM) Mammogram  1-3 years ago within last year Pap Smear  1-5 years ago  Allergies Faith Galvan, CMA; 08/14/2019 1:53 PM) Penicillins  Iodinated Contrast Media  Allergies Reconciled   Medication History Faith Galvan, CMA; 08/14/2019 1:56 PM) Faith Galvan (375MG  Capsule, Oral) Active. Calcium /Vitamin D (Oral) Active. Valsartan-hydroCHLOROthiazide (80-12.5MG  Tablet, Oral) Active. Vitamin C (500MG  Capsule, Oral) Active. Vitamin D (1000UNIT Capsule, Oral) Active. Voltaren (1% Gel, External) Active. B Complex-C-Folic Acid (Oral) Active. Medications Reconciled  Social History Faith Galvan, [place]; 08/14/2019 1:52 PM) Alcohol use  Occasional alcohol use. Caffeine use  Coffee, Tea. No drug use  Tobacco use  Former smoker.  Family History Faith Galvan, [place]; 08/14/2019 1:52 PM) Alcohol Abuse   Father. Arthritis  Family Members In General, Father, Mother. Cancer  Family Members In General. Cerebrovascular Accident  Family Members In General. Diabetes Mellitus  Father, Mother. Heart Disease  Family Members In General, Father, Mother. Heart disease in female family member before age 58  Heart disease in female family member before age 5  Hypertension  Family Members In General, Father, Mother.  Pregnancy / Birth History Faith Galvan, Faith Galvan; 08/14/2019 1:52 PM) Age at menarche  36 years. Age of menopause  66-50 Gravida  1 0 Irregular periods  Para  0  Other Problems Faith Galvan, [place]; 08/14/2019 1:52 PM) Arthritis  Heart murmur  Inguinal Hernia  Kidney Stone     Review of Systems Faith Galvan CMA; 08/14/2019 1:52 PM) General Present- Night Sweats and Weight Gain. Not Present- Appetite Loss, Chills, Fatigue, Fever and Weight Loss. Skin Not Present- Change in Wart/Mole, Dryness, Hives, Jaundice, New Lesions, Non-Healing Wounds, Rash and Ulcer. HEENT Not Present- Earache, Hearing Loss, Hoarseness, Nose Bleed, Oral Ulcers, Ringing in the Ears, Seasonal Allergies, Sinus Pain, Sore Throat, Visual Disturbances, Wears glasses/contact lenses and Yellow Eyes. Respiratory Not Present- Bloody sputum, Chronic Cough, Difficulty Breathing, Snoring and Wheezing. Breast Not Present- Breast Mass, Breast Pain, Nipple Discharge and Skin Changes. Cardiovascular Not Present- Chest Pain, Difficulty Breathing Lying Down, Leg Cramps, Palpitations, Rapid Heart Rate, Shortness of Breath and Swelling of Extremities. Gastrointestinal Present- Change in Bowel Habits, Excessive gas, Indigestion and Nausea. Not Present- Abdominal Pain, Bloating, Bloody Stool, Chronic diarrhea, Constipation, Difficulty Swallowing, Gets full quickly at meals, Hemorrhoids, Rectal Pain and Vomiting. Female Genitourinary Not Present- Frequency, Nocturia, Painful Urination, Pelvic Pain and Urgency. Musculoskeletal  Present- Back Pain and Joint Pain. Not Present- Joint Stiffness, Muscle  Pain, Muscle Weakness and Swelling of Extremities. Neurological Not Present- Decreased Memory, Fainting, Headaches, Numbness, Seizures, Tingling, Tremor, Trouble walking and Weakness. Psychiatric Not Present- Anxiety, Bipolar, Change in Sleep Pattern, Depression, Fearful and Frequent crying. Endocrine Not Present- Cold Intolerance, Excessive Hunger, Hair Changes, Heat Intolerance, Hot flashes and New Diabetes. Hematology Not Present- Blood Thinners, Easy Bruising, Excessive bleeding, Gland problems, HIV and Persistent Infections.  Vitals Faith Galvan CMA; 08/14/2019 1:57 PM) 08/14/2019 1:56 PM Weight: 178.8 lb Height: 64in Body Surface Area: 1.87 m Body Mass Index: 30.69 kg/m  Temp.: 98.46F  Pulse: 98 (Regular)  P.OX: 98% (Room air) BP: 145/85 (Sitting, Left Arm, Standard)       Physical Exam (Faith Jasmin A. Ninfa Linden MD; 08/14/2019 2:02 PM) General Mental Status-Alert. General Appearance-Consistent with stated age. Hydration-Well hydrated. Voice-Normal.  Head and Neck Head-normocephalic, atraumatic with no lesions or palpable masses.  Eye Eyeball - Bilateral-Extraocular movements intact. Sclera/Conjunctiva - Bilateral-No scleral icterus.  Chest and Lung Exam Chest and lung exam reveals -quiet, even and easy respiratory effort with no use of accessory muscles and on auscultation, normal breath sounds, no adventitious sounds and normal vocal resonance. Inspection Chest Wall - Normal. Back - normal.  Cardiovascular Cardiovascular examination reveals -on palpation PMI is normal in location and amplitude, no palpable S3 or S4. Normal cardiac borders., normal heart sounds, regular rate and rhythm with no murmurs, carotid auscultation reveals no bruits and normal pedal pulses bilaterally.  Abdomen Inspection Inspection of the abdomen reveals - No Hernias. Skin - Scar - no surgical  scars. Palpation/Percussion Palpation and Percussion of the abdomen reveal - Soft, No Rebound tenderness, No Rigidity (guarding) and No hepatosplenomegaly. Tenderness - Right Upper Quadrant. Auscultation Auscultation of the abdomen reveals - Bowel sounds normal. Note: Her umbilical hernia is unchanged   Neurologic Neurologic evaluation reveals -alert and oriented x 3 with no impairment of recent or remote memory. Mental Status-Normal.  Musculoskeletal Normal Exam - Left-Upper Extremity Strength Normal and Lower Extremity Strength Normal. Normal Exam - Right-Upper Extremity Strength Normal, Lower Extremity Weakness.    Assessment & Plan (Mitzi Lilja A. Ninfa Linden MD; 08/14/2019 [id number] PM) BILIARY COLIC ([id number]) Impression: This is a patient with biliary colic, suspect small gallstones based on her ultrasound. She also has a known umbilical hernia which we have discussed in the past. I discussed gallbladder disease with her in detail and gave her literature regarding this. We discussed proceeding with a laparoscopic cholecystectomy. I will repair the umbilical hernia primarily at the same time. We discussed the risk of Galvan which includes but is not limited to bleeding, infection, injury to surrounding structures, need to convert to an open procedure, hernia recurrence, cardiopulmonary issues, postoperative recovery, etc. She understands and wishes to proceed with Galvan which will be scheduled. UMBILICAL HERNIA ([id number])

## 2019-09-06 ENCOUNTER — Ambulatory Visit (HOSPITAL_COMMUNITY): Payer: No Typology Code available for payment source | Admitting: Certified Registered Nurse Anesthetist

## 2019-09-06 ENCOUNTER — Ambulatory Visit (HOSPITAL_COMMUNITY): Payer: No Typology Code available for payment source | Admitting: Vascular Surgery

## 2019-09-06 ENCOUNTER — Encounter (HOSPITAL_COMMUNITY)
Admission: RE | Disposition: A | Discharge status: Home / Self Care | Payer: Self-pay | Source: Home / Self Care | Attending: Surgery

## 2019-09-06 ENCOUNTER — Encounter (HOSPITAL_COMMUNITY): Payer: Self-pay

## 2019-09-06 ENCOUNTER — Other Ambulatory Visit: Payer: Self-pay

## 2019-09-06 ENCOUNTER — Observation Stay (HOSPITAL_COMMUNITY)
Admission: RE | Admit: 2019-09-06 | Discharge: 2019-09-07 | Disposition: A | Discharge status: Home / Self Care | Payer: No Typology Code available for payment source | Attending: Surgery | Admitting: Surgery

## 2019-09-06 DIAGNOSIS — Z79899 Other long term (current) drug therapy: Secondary | ICD-10-CM | POA: Diagnosis not present

## 2019-09-06 DIAGNOSIS — Z86010 Personal history of colonic polyps: Secondary | ICD-10-CM | POA: Insufficient documentation

## 2019-09-06 DIAGNOSIS — Z8261 Family history of arthritis: Secondary | ICD-10-CM | POA: Insufficient documentation

## 2019-09-06 DIAGNOSIS — K429 Umbilical hernia without obstruction or gangrene: Secondary | ICD-10-CM | POA: Diagnosis not present

## 2019-09-06 DIAGNOSIS — R011 Cardiac murmur, unspecified: Secondary | ICD-10-CM | POA: Insufficient documentation

## 2019-09-06 DIAGNOSIS — Z791 Long term (current) use of non-steroidal anti-inflammatories (NSAID): Secondary | ICD-10-CM | POA: Diagnosis not present

## 2019-09-06 DIAGNOSIS — Z811 Family history of alcohol abuse and dependence: Secondary | ICD-10-CM | POA: Insufficient documentation

## 2019-09-06 DIAGNOSIS — K8044 Calculus of bile duct with chronic cholecystitis without obstruction: Secondary | ICD-10-CM | POA: Diagnosis present

## 2019-09-06 DIAGNOSIS — Z833 Family history of diabetes mellitus: Secondary | ICD-10-CM | POA: Diagnosis not present

## 2019-09-06 DIAGNOSIS — K811 Chronic cholecystitis: Secondary | ICD-10-CM | POA: Diagnosis not present

## 2019-09-06 DIAGNOSIS — Z8249 Family history of ischemic heart disease and other diseases of the circulatory system: Secondary | ICD-10-CM | POA: Insufficient documentation

## 2019-09-06 DIAGNOSIS — Z823 Family history of stroke: Secondary | ICD-10-CM | POA: Insufficient documentation

## 2019-09-06 DIAGNOSIS — K828 Other specified diseases of gallbladder: Secondary | ICD-10-CM | POA: Diagnosis not present

## 2019-09-06 DIAGNOSIS — I10 Essential (primary) hypertension: Secondary | ICD-10-CM | POA: Insufficient documentation

## 2019-09-06 DIAGNOSIS — Z880 Allergy status to penicillin: Secondary | ICD-10-CM | POA: Diagnosis not present

## 2019-09-06 DIAGNOSIS — M1990 Unspecified osteoarthritis, unspecified site: Secondary | ICD-10-CM | POA: Diagnosis not present

## 2019-09-06 DIAGNOSIS — Z87442 Personal history of urinary calculi: Secondary | ICD-10-CM | POA: Diagnosis not present

## 2019-09-06 DIAGNOSIS — Z87891 Personal history of nicotine dependence: Secondary | ICD-10-CM | POA: Diagnosis not present

## 2019-09-06 DIAGNOSIS — Z809 Family history of malignant neoplasm, unspecified: Secondary | ICD-10-CM | POA: Diagnosis not present

## 2019-09-06 DIAGNOSIS — Z86718 Personal history of other venous thrombosis and embolism: Secondary | ICD-10-CM | POA: Insufficient documentation

## 2019-09-06 DIAGNOSIS — Z91041 Radiographic dye allergy status: Secondary | ICD-10-CM | POA: Insufficient documentation

## 2019-09-06 DIAGNOSIS — Z9049 Acquired absence of other specified parts of digestive tract: Secondary | ICD-10-CM

## 2019-09-06 HISTORY — PX: CHOLECYSTECTOMY: SHX55

## 2019-09-06 SURGERY — LAPAROSCOPIC CHOLECYSTECTOMY
Anesthesia: General

## 2019-09-06 MED ORDER — MORPHINE SULFATE (PF) 2 MG/ML IV SOLN
1.0000 mg | INTRAVENOUS | Status: DC | PRN
  Administered 2019-09-06 (×2): 2 mg via INTRAVENOUS
  Filled 2019-09-06 (×2): qty 1

## 2019-09-06 MED ORDER — MIDAZOLAM HCL 2 MG/2ML IJ SOLN
INTRAMUSCULAR | Status: DC | PRN
  Administered 2019-09-06 (×2): 1 mg via INTRAVENOUS

## 2019-09-06 MED ORDER — FENTANYL CITRATE (PF) 250 MCG/5ML IJ SOLN
INTRAMUSCULAR | Status: AC
  Filled 2019-09-06: qty 5

## 2019-09-06 MED ORDER — MIDAZOLAM HCL 2 MG/2ML IJ SOLN
INTRAMUSCULAR | Status: AC
  Filled 2019-09-06: qty 2

## 2019-09-06 MED ORDER — CELECOXIB 200 MG PO CAPS
200.0000 mg | ORAL_CAPSULE | Freq: Two times a day (BID) | ORAL | Status: DC
  Administered 2019-09-06: 200 mg via ORAL
  Filled 2019-09-06 (×2): qty 1

## 2019-09-06 MED ORDER — SUGAMMADEX SODIUM 200 MG/2ML IV SOLN
INTRAVENOUS | Status: DC | PRN
  Administered 2019-09-06: 200 mg via INTRAVENOUS

## 2019-09-06 MED ORDER — CELECOXIB 200 MG PO CAPS
200.0000 mg | ORAL_CAPSULE | ORAL | Status: AC
  Administered 2019-09-06: 06:00:00 200 mg via ORAL

## 2019-09-06 MED ORDER — ONDANSETRON 4 MG PO TBDP: 4.0000 mg | ORAL_TABLET | Freq: Four times a day (QID) | ORAL | Status: DC | PRN

## 2019-09-06 MED ORDER — POLYVINYL ALCOHOL 1.4 % OP SOLN
1.0000 [drp] | Freq: Every day | OPHTHALMIC | Status: DC
  Administered 2019-09-07: 09:00:00 1 [drp] via OPHTHALMIC
  Filled 2019-09-06: qty 15

## 2019-09-06 MED ORDER — DIPHENHYDRAMINE HCL 12.5 MG/5ML PO ELIX: 12.5000 mg | ORAL_SOLUTION | Freq: Four times a day (QID) | ORAL | Status: DC | PRN

## 2019-09-06 MED ORDER — DIPHENHYDRAMINE HCL 50 MG/ML IJ SOLN: 12.5000 mg | Freq: Four times a day (QID) | INTRAMUSCULAR | Status: DC | PRN

## 2019-09-06 MED ORDER — TRAMADOL HCL 50 MG PO TABS
50.0000 mg | ORAL_TABLET | Freq: Four times a day (QID) | ORAL | Status: DC | PRN
  Administered 2019-09-06 – 2019-09-07 (×2): 50 mg via ORAL
  Filled 2019-09-06 (×2): qty 1

## 2019-09-06 MED ORDER — IRBESARTAN 75 MG PO TABS
37.5000 mg | ORAL_TABLET | Freq: Every day | ORAL | Status: DC
  Administered 2019-09-07: 37.5 mg via ORAL
  Filled 2019-09-06 (×2): qty 1

## 2019-09-06 MED ORDER — ACETAMINOPHEN 500 MG PO TABS
1000.0000 mg | ORAL_TABLET | ORAL | Status: AC
  Administered 2019-09-06: 06:00:00 1000 mg via ORAL

## 2019-09-06 MED ORDER — FENTANYL CITRATE (PF) 250 MCG/5ML IJ SOLN
INTRAMUSCULAR | Status: DC | PRN
  Administered 2019-09-06 (×5): 50 ug via INTRAVENOUS

## 2019-09-06 MED ORDER — CARBOXYMETHYLCELLULOSE SODIUM 0.5 % OP SOLN: 1.0000 [drp] | Freq: Every day | OPHTHALMIC | Status: DC

## 2019-09-06 MED ORDER — OXYCODONE HCL 5 MG PO TABS
5.0000 mg | ORAL_TABLET | ORAL | Status: DC | PRN
  Administered 2019-09-06 – 2019-09-07 (×5): 10 mg via ORAL
  Filled 2019-09-06 (×4): qty 2

## 2019-09-06 MED ORDER — ONDANSETRON HCL 4 MG/2ML IJ SOLN: 4.0000 mg | Freq: Once | INTRAMUSCULAR | Status: DC | PRN

## 2019-09-06 MED ORDER — 0.9 % SODIUM CHLORIDE (POUR BTL) OPTIME
TOPICAL | Status: DC | PRN
  Administered 2019-09-06: 1000 mL

## 2019-09-06 MED ORDER — POTASSIUM CHLORIDE IN NACL 20-0.9 MEQ/L-% IV SOLN
INTRAVENOUS | Status: DC
  Administered 2019-09-06: 13:00:00 via INTRAVENOUS
  Filled 2019-09-06: qty 1000

## 2019-09-06 MED ORDER — DEXAMETHASONE SODIUM PHOSPHATE 10 MG/ML IJ SOLN
INTRAMUSCULAR | Status: DC | PRN
  Administered 2019-09-06: 10 mg via INTRAVENOUS

## 2019-09-06 MED ORDER — ACETAMINOPHEN 500 MG PO TABS
ORAL_TABLET | ORAL | Status: AC
  Administered 2019-09-06: 1000 mg via ORAL
  Filled 2019-09-06: qty 2

## 2019-09-06 MED ORDER — CELECOXIB 200 MG PO CAPS
ORAL_CAPSULE | ORAL | Status: AC
  Administered 2019-09-06: 200 mg via ORAL
  Filled 2019-09-06: qty 1

## 2019-09-06 MED ORDER — LIDOCAINE 2% (20 MG/ML) 5 ML SYRINGE
INTRAMUSCULAR | Status: DC | PRN
  Administered 2019-09-06: 100 mg via INTRAVENOUS

## 2019-09-06 MED ORDER — ENOXAPARIN SODIUM 40 MG/0.4ML ~~LOC~~ SOLN
40.0000 mg | SUBCUTANEOUS | Status: DC
  Administered 2019-09-07: 08:00:00 40 mg via SUBCUTANEOUS
  Filled 2019-09-06: qty 0.4

## 2019-09-06 MED ORDER — SODIUM CHLORIDE 0.9 % IR SOLN
Status: DC | PRN
  Administered 2019-09-06: 1000 mL

## 2019-09-06 MED ORDER — OXYCODONE HCL 5 MG PO TABS
ORAL_TABLET | ORAL | Status: AC
  Filled 2019-09-06: qty 2

## 2019-09-06 MED ORDER — GABAPENTIN 300 MG PO CAPS
300.0000 mg | ORAL_CAPSULE | Freq: Two times a day (BID) | ORAL | Status: DC
  Administered 2019-09-06: 22:00:00 300 mg via ORAL
  Filled 2019-09-06: qty 1

## 2019-09-06 MED ORDER — BUPIVACAINE HCL 0.5 % IJ SOLN
INTRAMUSCULAR | Status: DC | PRN
  Administered 2019-09-06: 20 mL

## 2019-09-06 MED ORDER — LACTATED RINGERS IV SOLN
INTRAVENOUS | Status: DC | PRN
  Administered 2019-09-06: 07:00:00 via INTRAVENOUS

## 2019-09-06 MED ORDER — PROPOFOL 10 MG/ML IV BOLUS
INTRAVENOUS | Status: DC | PRN
  Administered 2019-09-06: 100 mg via INTRAVENOUS

## 2019-09-06 MED ORDER — VALSARTAN-HYDROCHLOROTHIAZIDE 80-12.5 MG PO TABS: 0.5000 | ORAL_TABLET | Freq: Every day | ORAL | Status: DC

## 2019-09-06 MED ORDER — ONDANSETRON HCL 4 MG/2ML IJ SOLN: 4.0000 mg | Freq: Four times a day (QID) | INTRAMUSCULAR | Status: DC | PRN

## 2019-09-06 MED ORDER — FENTANYL CITRATE (PF) 100 MCG/2ML IJ SOLN
INTRAMUSCULAR | Status: AC
  Filled 2019-09-06: qty 2

## 2019-09-06 MED ORDER — ONDANSETRON HCL 4 MG/2ML IJ SOLN
INTRAMUSCULAR | Status: DC | PRN
  Administered 2019-09-06: 4 mg via INTRAVENOUS

## 2019-09-06 MED ORDER — FENTANYL CITRATE (PF) 100 MCG/2ML IJ SOLN
25.0000 ug | INTRAMUSCULAR | Status: DC | PRN
  Administered 2019-09-06 (×2): 50 ug via INTRAVENOUS

## 2019-09-06 MED ORDER — HYDROCHLOROTHIAZIDE 10 MG/ML ORAL SUSPENSION
6.2500 mg | Freq: Every day | ORAL | Status: DC
  Administered 2019-09-07: 6.25 mg via ORAL
  Filled 2019-09-06: qty 1.25

## 2019-09-06 MED ORDER — CIPROFLOXACIN IN D5W 400 MG/200ML IV SOLN
400.0000 mg | INTRAVENOUS | Status: AC
  Administered 2019-09-06: 400 mg via INTRAVENOUS

## 2019-09-06 MED ORDER — CHLORHEXIDINE GLUCONATE CLOTH 2 % EX PADS: 6.0000 | MEDICATED_PAD | Freq: Once | CUTANEOUS | Status: DC

## 2019-09-06 MED ORDER — CIPROFLOXACIN IN D5W 400 MG/200ML IV SOLN
INTRAVENOUS | Status: AC
  Filled 2019-09-06: qty 200

## 2019-09-06 MED ORDER — GABAPENTIN 300 MG PO CAPS
300.0000 mg | ORAL_CAPSULE | ORAL | Status: AC
  Administered 2019-09-06: 06:00:00 300 mg via ORAL

## 2019-09-06 MED ORDER — GABAPENTIN 300 MG PO CAPS
ORAL_CAPSULE | ORAL | Status: AC
  Administered 2019-09-06: 06:00:00 300 mg via ORAL
  Filled 2019-09-06: qty 1

## 2019-09-06 MED ORDER — PROPOFOL 10 MG/ML IV BOLUS
INTRAVENOUS | Status: AC
  Filled 2019-09-06: qty 20

## 2019-09-06 MED ORDER — ROCURONIUM BROMIDE 10 MG/ML (PF) SYRINGE
PREFILLED_SYRINGE | INTRAVENOUS | Status: DC | PRN
  Administered 2019-09-06: 80 mg via INTRAVENOUS

## 2019-09-06 MED ORDER — BUPIVACAINE HCL (PF) 0.5 % IJ SOLN
INTRAMUSCULAR | Status: AC
  Filled 2019-09-06: qty 30

## 2019-09-06 SURGICAL SUPPLY — 36 items
APPLIER CLIP 5 13 M/L LIGAMAX5 (MISCELLANEOUS) ×2
CANISTER SUCT 3000ML PPV (MISCELLANEOUS) ×2 IMPLANT
CHLORAPREP W/TINT 26 (MISCELLANEOUS) ×2 IMPLANT
CLIP APPLIE 5 13 M/L LIGAMAX5 (MISCELLANEOUS) ×1 IMPLANT
COVER SURGICAL LIGHT HANDLE (MISCELLANEOUS) ×2 IMPLANT
COVER WAND RF STERILE (DRAPES) IMPLANT
DERMABOND ADVANCED (GAUZE/BANDAGES/DRESSINGS) ×1
DERMABOND ADVANCED .7 DNX12 (GAUZE/BANDAGES/DRESSINGS) ×1 IMPLANT
ELECT REM PT RETURN 9FT ADLT (ELECTROSURGICAL) ×2
ELECTRODE REM PT RTRN 9FT ADLT (ELECTROSURGICAL) ×1 IMPLANT
GLOVE BIOGEL PI IND STRL 6.5 (GLOVE) ×3 IMPLANT
GLOVE BIOGEL PI INDICATOR 6.5 (GLOVE) ×3
GLOVE ECLIPSE 6.5 STRL STRAW (GLOVE) ×2 IMPLANT
GLOVE SURG SIGNA 7.5 PF LTX (GLOVE) ×2 IMPLANT
GOWN STRL REUS W/ TWL LRG LVL3 (GOWN DISPOSABLE) ×2 IMPLANT
GOWN STRL REUS W/ TWL XL LVL3 (GOWN DISPOSABLE) ×1 IMPLANT
GOWN STRL REUS W/TWL LRG LVL3 (GOWN DISPOSABLE) ×2
GOWN STRL REUS W/TWL XL LVL3 (GOWN DISPOSABLE) ×1
KIT BASIN OR (CUSTOM PROCEDURE TRAY) ×2 IMPLANT
KIT TURNOVER KIT B (KITS) ×2 IMPLANT
NS IRRIG 1000ML POUR BTL (IV SOLUTION) ×2 IMPLANT
PAD ARMBOARD 7.5X6 YLW CONV (MISCELLANEOUS) ×2 IMPLANT
PENCIL SMOKE EVACUATOR (MISCELLANEOUS) ×2 IMPLANT
POUCH SPECIMEN RETRIEVAL 10MM (ENDOMECHANICALS) ×2 IMPLANT
SCISSORS LAP 5X35 DISP (ENDOMECHANICALS) ×2 IMPLANT
SET IRRIG TUBING LAPAROSCOPIC (IRRIGATION / IRRIGATOR) ×2 IMPLANT
SET TUBE SMOKE EVAC HIGH FLOW (TUBING) ×2 IMPLANT
SLEEVE ENDOPATH XCEL 5M (ENDOMECHANICALS) ×4 IMPLANT
SPECIMEN JAR SMALL (MISCELLANEOUS) ×2 IMPLANT
SUT MNCRL AB 4-0 PS2 18 (SUTURE) ×2 IMPLANT
TOWEL GREEN STERILE (TOWEL DISPOSABLE) ×2 IMPLANT
TOWEL GREEN STERILE FF (TOWEL DISPOSABLE) ×2 IMPLANT
TRAY LAPAROSCOPIC MC (CUSTOM PROCEDURE TRAY) ×2 IMPLANT
TROCAR XCEL BLUNT TIP 100MML (ENDOMECHANICALS) ×2 IMPLANT
TROCAR XCEL NON-BLD 5MMX100MML (ENDOMECHANICALS) ×2 IMPLANT
WATER STERILE IRR 1000ML POUR (IV SOLUTION) ×2 IMPLANT

## 2019-09-06 NOTE — Interval H&P Note (Signed)
History and Physical Interval Note:no change in H and P  09/06/2019 6:42 AM  Faith Galvan  has presented today for surgery, with the diagnosis of BILIARY COLIC, UMBILICAL HERNIA.  The various methods of treatment have been discussed with the patient and family. After consideration of risks, benefits and other options for treatment, the patient has consented to  Procedure(s): LAPAROSCOPIC CHOLECYSTECTOMY, UMBILICIAL HERNIA REPAIR (N/A) as a surgical intervention.  The patient's history has been reviewed, patient examined, no change in status, stable for surgery.  I have reviewed the patient's chart and labs.  Questions were answered to the patient's satisfaction.     Coralie Keens

## 2019-09-06 NOTE — Progress Notes (Signed)
Patient admitted to unit from PACU, VSS. Patient settled in bed and oriented to unit and hospital routine. Patient has 4 lap sites to abdomen with skin glue in place, CDI.  Pt resting transferred to chair, with call bell in reach and side rails up. Instructed patient to utilize call bell for assistance. Will continue to monitor closely for remainder of shift.

## 2019-09-06 NOTE — Plan of Care (Signed)
  Problem: Education: Goal: Knowledge of General Education information will improve Description: Including pain rating scale, medication(s)/side effects and non-pharmacologic comfort measures Outcome: Progressing   Problem: Activity: Goal: Risk for activity intolerance will decrease Outcome: Adequate for Discharge   Problem: Nutrition: Goal: Adequate nutrition will be maintained Outcome: Adequate for Discharge   Problem: Elimination: Goal: Will not experience complications related to bowel motility Outcome: Progressing   Problem: Pain Managment: Goal: General experience of comfort will improve Outcome: Progressing

## 2019-09-06 NOTE — Op Note (Signed)
Laparoscopic Cholecystectomy, Umbilical hernia repair Procedure Note  Indications: This patient presents with symptomatic gallbladder disease and will undergo laparoscopic cholecystectomy.  Patient also has an umbilical hernia that will be repaired  Pre-operative Diagnosis: biliary colic, umbilical hernia  Post-operative Diagnosis: Same  Surgeon: Coralie Keens   Assistants: 0  Anesthesia: General endotracheal anesthesia  ASA Class: 2  Procedure Details  The patient was seen again in the Holding Room. The risks, benefits, complications, treatment options, and expected outcomes were discussed with the patient. The possibilities of reaction to medication, pulmonary aspiration, perforation of viscus, bleeding, recurrent infection, finding a normal gallbladder, the need for additional procedures, failure to diagnose a condition, the possible need to convert to an open procedure, and creating a complication requiring transfusion or operation were discussed with the patient. The likelihood of improving the patient's symptoms with return to their baseline status is good.  The patient and/or family concurred with the proposed plan, giving informed consent. The site of surgery properly noted. The patient was taken to Operating Room, identified as Faith Galvan and the procedure verified as Laparoscopic Cholecystectomy with Intraoperative Cholangiogram. A Time Out was held and the above information confirmed.  Prior to the induction of general anesthesia, antibiotic prophylaxis was administered. General endotracheal anesthesia was then administered and tolerated well. After the induction, the abdomen was prepped with Chloraprep and draped in sterile fashion. The patient was positioned in the supine position.  Local anesthetic agent was injected into the skin near the umbilicus and an incision made over the top of an umbilical hernia.  The hernia contained preperitoneal fat which I excised with  the cautery.  I then used this to enter the abdominal cavity.  I placed a 0 Vicryl pursing suture around the fascial opening.   The Hasson cannula was inserted and secured with the stay suture.  Pneumoperitoneum was then created with CO2 and tolerated well without any adverse changes in the patient's vital signs. A 5-mm port was placed in the subxiphoid position.  Two 5-mm ports were placed in the right upper quadrant. All skin incisions were infiltrated with a local anesthetic agent before making the incision and placing the trocars.   We positioned the patient in reverse Trendelenburg, tilted slightly to the patient's left.  The gallbladder was identified, the fundus grasped and retracted cephalad. Adhesions were lysed bluntly and with the electrocautery where indicated, taking care not to injure any adjacent organs or viscus. The infundibulum was grasped and retracted laterally, exposing the peritoneum overlying the triangle of Calot. This was then divided and exposed in a blunt fashion. The cystic duct was clearly identified and bluntly dissected circumferentially. A critical view of the cystic duct and cystic artery was obtained.  The cystic duct was then ligated with clips and divided. The cystic artery was, dissected free, ligated with clips and divided as well.   The gallbladder was dissected from the liver bed in retrograde fashion with the electrocautery. The gallbladder was removed and placed in an Endocatch sac. The liver bed was irrigated and inspected. Hemostasis was achieved with the electrocautery. Copious irrigation was utilized and was repeatedly aspirated until clear.  The gallbladder and Endocatch sac were then removed through the umbilical port site.  The pursestring suture was used to close the umbilical fascia.  I then placed another figure-of-eight 0 Vicryl suture at the fascial closure as well to completely close the umbilical hernia defect.  We again inspected the right upper quadrant  for hemostasis.  Pneumoperitoneum  was released as we removed the trocars.  4-0 Monocryl was used to close the skin.  Skin glue was then applied. The patient was then extubated and brought to the recovery room in stable condition. Instrument, sponge, and needle counts were correct at closure and at the conclusion of the case.   Findings: Mild chronic Cholecystitis without Cholelithiasis Umbilical hernia  Estimated Blood Loss: Minimal         Drains: 0         Specimens: Gallbladder           Complications: None; patient tolerated the procedure well.         Disposition: PACU - hemodynamically stable.         Condition: stable

## 2019-09-06 NOTE — Transfer of Care (Signed)
Immediate Anesthesia Transfer of Care Note  Patient: Faith Galvan  Procedure(s) Performed: LAPAROSCOPIC CHOLECYSTECTOMY, UMBILICIAL HERNIA REPAIR (N/A )  Patient Location: PACU  Anesthesia Type:General  Level of Consciousness: awake, patient cooperative and responds to stimulation  Airway & Oxygen Therapy: Patient Spontanous Breathing and Patient connected to nasal cannula oxygen  Post-op Assessment: Report given to RN and Post -op Vital signs reviewed and stable  Post vital signs: Reviewed and stable  Last Vitals:  Vitals Value Taken Time  BP 164/90 09/06/19 0816  Temp    Pulse 65 09/06/19 0822  Resp 19 09/06/19 0822  SpO2 99 % 09/06/19 0822  Vitals shown include unvalidated device data.  Last Pain:  Vitals:   09/06/19 0818  PainSc: (P) 0-No pain         Complications: No apparent anesthesia complications

## 2019-09-07 ENCOUNTER — Ambulatory Visit: Payer: PRIVATE HEALTH INSURANCE

## 2019-09-07 ENCOUNTER — Encounter (HOSPITAL_COMMUNITY): Payer: Self-pay | Admitting: Surgery

## 2019-09-07 DIAGNOSIS — K811 Chronic cholecystitis: Secondary | ICD-10-CM | POA: Diagnosis not present

## 2019-09-07 MED ORDER — TRAMADOL HCL 50 MG PO TABS: 50.0000 mg | ORAL_TABLET | Freq: Four times a day (QID) | ORAL | 0 refills | Status: DC | PRN

## 2019-09-07 NOTE — Discharge Instructions (Signed)
[facility] ______[facility] LAPAROSCOPIC SURGERY: POST OP INSTRUCTIONS Always review your discharge instruction sheet given to you by the facility where your surgery was performed. IF YOU HAVE DISABILITY OR FAMILY LEAVE FORMS, YOU MUST BRING THEM TO THE OFFICE FOR PROCESSING.   DO NOT GIVE THEM TO YOUR DOCTOR.  1. A prescription for pain medication may be given to you upon discharge.  Take your pain medication as prescribed, if needed.  If narcotic pain medicine is not needed, then you may take acetaminophen (Tylenol) or ibuprofen (Advil) as needed. 2. Take your usually prescribed medications unless otherwise directed. 3. If you need a refill on your pain medication, please contact your pharmacy.  They will contact our office to request authorization. Prescriptions will not be filled after 5pm or on week-ends. 4. You should follow a light diet the first few days after arrival home, such as soup and crackers, etc.  Be sure to include lots of fluids daily. 5. Most patients will experience some swelling and bruising in the area of the incisions.  Ice packs will help.  Swelling and bruising can take several days to resolve.  6. It is common to experience some constipation if taking pain medication after surgery.  Increasing fluid intake and taking a stool softener (such as Colace) will usually help or prevent this problem from occurring.  A mild laxative (Milk of Magnesia or Miralax) should be taken according to package instructions if there are no bowel movements after 48 hours. 7. Unless discharge instructions indicate otherwise, you may remove your bandages 24-48 hours after surgery, and you may shower at that time.  You may have steri-strips (small skin tapes) in place directly over the incision.  These strips should be left on the skin for 7-10 days.  If your surgeon used skin glue on the incision, you may shower in 24 hours.  The glue will flake off over the next 2-3 weeks.  Any sutures or  staples will be removed at the office during your follow-up visit. 8. ACTIVITIES:  You may resume regular (light) daily activities beginning the next day--such as daily self-care, walking, climbing stairs--gradually increasing activities as tolerated.  You may have sexual intercourse when it is comfortable.  Refrain from any heavy lifting or straining until approved by your doctor. a. You may drive when you are no longer taking prescription pain medication, you can comfortably wear a seatbelt, and you can safely maneuver your car and apply brakes. b. RETURN TO WORK:  __________________________________________________________ 9. You should see your doctor in the office for a follow-up appointment approximately 2-3 weeks after your surgery.  Make sure that you call for this appointment within a day or two after you arrive home to insure a convenient appointment time. 10. OTHER INSTRUCTIONS:OK TO SHOWER STARTING TODAY 11. ICE PACK, TYLENOL, AND IBUPROFEN ALSO FOR PAIN 12. NO LIFTING MORE THAN 15 POUNDS FOR 3 WEEKS __________________________________________________________________________________________________________________________ __________________________________________________________________________________________________________________________ WHEN TO CALL YOUR DOCTOR: 1. Fever over 101.0 2. Inability to urinate 3. Continued bleeding from incision. 4. Increased pain, redness, or drainage from the incision. 5. Increasing abdominal pain  The clinic staff is available to answer your questions during regular business hours.  Please dont hesitate to call and ask to speak to one of the nurses for clinical concerns.  If you have a medical emergency, go to the nearest emergency room or call 911.  A surgeon from [facility] is always on call at the hospital. [address], [place],  [place], [place]  [address] ? [address] [address], [place],    [address] [phone] ?  [phone] ? FAX [phone] Web site: [email or web address] 

## 2019-09-07 NOTE — Progress Notes (Signed)
Patient discharged to home. Verbalized understanding of all discharge instructions including incision care, discharge medications and follow up MD visits. Patient left unit via wheelchair with nurse tech at [phone].

## 2019-09-07 NOTE — Progress Notes (Signed)
Patient ID: Faith Galvan, female   DOB: July 04, 1956, 63 y.o.   MRN: [id number]  Doing well Minimal pain  D/c home

## 2019-09-07 NOTE — Anesthesia Postprocedure Evaluation (Signed)
Anesthesia Post Note  Patient: Faith Galvan  Procedure(s) Performed: LAPAROSCOPIC CHOLECYSTECTOMY, UMBILICIAL HERNIA REPAIR (N/A )     Patient location during evaluation: PACU Anesthesia Type: General Level of consciousness: awake and alert Pain management: pain level controlled Vital Signs Assessment: post-procedure vital signs reviewed and stable Respiratory status: spontaneous breathing, nonlabored ventilation, respiratory function stable and patient connected to nasal cannula oxygen Cardiovascular status: blood pressure returned to baseline and stable Postop Assessment: no apparent nausea or vomiting Anesthetic complications: no    Last Vitals:  Vitals:   09/07/19 0156 09/07/19 0356  BP: 128/62 133/64  Pulse: 80 65  Resp: 16 19  Temp: 37 C 36.5 C  SpO2: 96% 100%    Last Pain:  Vitals:   09/07/19 0729  TempSrc:   PainSc: 3                  Tiajuana Amass

## 2019-09-07 NOTE — Discharge Summary (Signed)
Physician Discharge Summary  Patient ID: Faith Galvan MRN: [id number] DOB/AGE: 1956/03/28 63 y.o.  Admit date: 09/06/2019 Discharge date: 09/07/2019  Admission Diagnoses:  Discharge Diagnoses:  Active Problems:   S/P laparoscopic cholecystectomy   Discharged Condition: good  Hospital Course: UNEVENTFUL POST OP RECOVERY.  DISCHARGED HOME Pod#1  Consults: None  Significant Diagnostic Studies:   Treatments: surgery: lap chole  Discharge Exam: Blood pressure 133/64, pulse 65, temperature 97.7 F (36.5 C), temperature source Oral, resp. rate 19, height 5\' 4"  (1.626 m), weight 80.9 kg, SpO2 100 %. General appearance: alert, cooperative and no distress Resp: clear to auscultation bilaterally Cardio: regular rate and rhythm, S1, S2 normal, no murmur, click, rub or gallop Incision/Wound:abdomen soft, incisions clean  Disposition: Discharge disposition: 01-Home or Self Care        Allergies as of 09/07/2019      Reactions   Penicillins Rash   Did it involve swelling of the face/tongue/throat, SOB, or low BP? No Did it involve sudden or severe rash/hives, skin peeling, or any reaction on the inside of your mouth or nose?    #  #  YES  #  #  Did you need to seek medical attention at a hospital or doctor's office? No When did it last happen? More than 20 years ago If all above answers are "NO", may proceed with cephalosporin use.      Medication List    STOP taking these medications   meloxicam 15 MG tablet Commonly known as: MOBIC     TAKE these medications   AIRBORNE PO Take 1 tablet by mouth 2 (two) times a week.   BOSWELLIA PO Take 450 mg by mouth daily.   CALCIUM-MAGNESIUM-VITAMIN D PO Take 15 mLs by mouth daily. Liquid Calcium Magnesium with Vitamin D3 Complex By [employer]   cetirizine 10 MG tablet Commonly known as: ZYRTEC Take 10 mg by mouth daily. Aller-Tec   diclofenac sodium 1 % Gel Commonly known as: VOLTAREN Apply 1 g topically 4  (four) times daily as needed (knee pain.).   FOLIC ACID PO Take 1 tablet by mouth daily.   ibuprofen 200 MG tablet Commonly known as: ADVIL Take 400 mg by mouth every 8 (eight) hours as needed (pain.).   Lubricant Eye Drops 0.5 % Soln Generic drug: carboxymethylcellulose Place 1 drop into both eyes daily.   traMADol 50 MG tablet Commonly known as: ULTRAM Take 1-2 tablets (50-100 mg total) by mouth every 6 (six) hours as needed for moderate pain or severe pain.   valsartan-hydrochlorothiazide 80-12.5 MG tablet Commonly known as: DIOVAN-HCT Take 0.5 tablets by mouth daily.   VITAMIN B-12 PO Place 1 drop under the tongue daily. Vitamin B-12 Liquid   Vitamin D-3 125 MCG (5000 UT) Tabs Take 5,000 mg by mouth daily.      Follow-up Information    Coralie Keens, MD. Schedule an appointment as soon as possible for a visit in 3 week(s).   Specialty: General Surgery Contact information: [place] [address] [phone]           Signed: Coralie Keens 09/07/2019, 8:05 AM

## 2019-09-09 LAB — SURGICAL PATHOLOGY

## 2019-12-12 ENCOUNTER — Encounter: Payer: Self-pay | Admitting: Orthopaedic Surgery

## 2019-12-12 ENCOUNTER — Other Ambulatory Visit: Payer: Self-pay

## 2019-12-12 ENCOUNTER — Ambulatory Visit: Payer: Self-pay

## 2019-12-12 ENCOUNTER — Ambulatory Visit: Payer: 59 | Admitting: Orthopaedic Surgery

## 2019-12-12 DIAGNOSIS — G8929 Other chronic pain: Secondary | ICD-10-CM | POA: Diagnosis not present

## 2019-12-12 DIAGNOSIS — M25552 Pain in left hip: Secondary | ICD-10-CM

## 2019-12-12 MED ORDER — METHYLPREDNISOLONE ACETATE 40 MG/ML IJ SUSP
40.0000 mg | INTRAMUSCULAR | Status: AC | PRN
  Administered 2019-12-12: 09:00:00 40 mg via INTRA_ARTICULAR

## 2019-12-12 MED ORDER — LIDOCAINE HCL 1 % IJ SOLN
3.0000 mL | INTRAMUSCULAR | Status: AC | PRN
  Administered 2019-12-12: 09:00:00 3 mL

## 2019-12-12 NOTE — Progress Notes (Signed)
Office Visit Note   Patient: Faith Galvan           Date of Birth: July 26, 1956           MRN: [id number] Visit Date: 12/12/2019              Requested by: Josetta Huddle, MD [address] [place],  [place] [address] PCP: Josetta Huddle, MD   Assessment & Plan: Visit Diagnoses:  1. Pain in left hip     Plan: She will begin using Voltaren gel up to 4 times daily to the left hip trochanteric region.  Do IT band stretching exercises as shown.  Refrain from abduction exercises of the hip.  Follow-up with [place] as needed.  Questions encouraged and answered by Dr. Ninfa Linden myself  Follow-Up Instructions: Return if symptoms worsen or fail to improve.   Orders:  Orders Placed This Encounter  Procedures  . Large Joint Inj  . XR HIP UNILAT W OR W/O PELVIS 2-3 VIEWS LEFT   No orders of the defined types were placed in this encounter.     Procedures: Large Joint Inj: L greater trochanter on 12/12/2019 8:52 AM Indications: pain Details: 22 G 1.5 in needle, lateral approach  Arthrogram: No  Medications: 3 mL lidocaine 1 %; 40 mg methylPREDNISolone acetate 40 MG/ML Outcome: tolerated well, no immediate complications Procedure, treatment alternatives, risks and benefits explained, specific risks discussed. Consent was given by the patient. Immediately prior to procedure a time out was called to verify the correct patient, procedure, equipment, support staff and site/side marked as required. Patient was prepped and draped in the usual sterile fashion.       Clinical Data: No additional findings.   Subjective: Chief Complaint  Patient presents with  . Left Hip - Pain    HPI Faith Galvan is 64 year old female comes in today due to left hip pain has been ongoing for the past 2 weeks.  Pain is mostly lateral aspect of the hip.  Some groin-like pain occasionally.  Very achy.  Worse with sitting or sleeping especially if she is lying on the left hip.  She denies any  numbness tingling down the leg.  She has had some low back pain over the last couple of days initially this all started off as lateral hip pain.  She has tried heat, Advil and Tylenol which helps some.  She is working on gait balance and strengthening and has been doing  abduction exercises of the hips.  No known injury.  Review of Systems No fevers or chills. See HPI  Objective: Vital Signs: There were no vitals taken for this visit.  Physical Exam Constitutional:      General: She is not in acute distress.    Appearance: She is not ill-appearing or diaphoretic.  Pulmonary:     Effort: Pulmonary effort is normal.  Neurological:     Mental Status: She is alert and oriented to person, place, and time.  Psychiatric:        Mood and Affect: Mood normal.        Behavior: Behavior normal.     Ortho Exam Bilateral hips good range of motion without pain except for the left hip with extreme external rotation.  Tenderness over the left greater trochanteric region.  Straight leg raise is negative bilaterally.  Bilateral knees with slight valgus deformities.  No significant tenderness along medial lateral joint line left knee.  Right knee with moderate patellofemoral crepitus with passive  range of motion.  Otherwise both knees with good range of motion. Specialty Comments:  No specialty comments available.  Imaging: XR HIP UNILAT W OR W/O PELVIS 2-3 VIEWS LEFT  Result Date: 12/12/2019 AP pelvis and lateral view left hip: Both hips well located.  Hip joints are well-maintained.  No acute fractures.  Left greater trochanteric region with large osteophyte off the lateral aspect.  No bony abnormalities otherwise    PMFS History: Patient Active Problem List   Diagnosis Date Noted  . S/P laparoscopic cholecystectomy 09/06/2019  . Overweight with body mass index (BMI) 25.0-29.9 03/15/2018  . Osteopenia 03/15/2018  . Intolerant of heat 03/15/2018  . Low back pain 03/15/2018  . Menopausal  symptom 03/15/2018  . Urinary tract infectious disease 03/15/2018  . Sprain of ankle 06/24/2016  . Avulsion of right ankle 02/24/2016  . Toe pain, left 08/26/2015  . 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 Hallux limitus of left foot 08/26/2015  . Abnormal Pap smear of cervix 08/31/2012  . Abnormal Pap smear, atypical squamous cells of undetermined sign (ASC-US) 02/24/2012  . Onychomycosis due to dermatophyte 08/28/2009  . Disorder of bone and cartilage 10/01/2008  . Allergic rhinitis 09/06/2008  . Benign neoplasm of colon 09/06/2008  . Hx of adenomatous polyp of rectum 02/19/2008   Past Medical History:  Diagnosis Date  . 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 Allergy PCN  . Arthritis   . H/O blood clots    18 months old - blood clots in right leg  . Hx of adenomatous polyp of rectum 02/19/2008  . Hypertension   . Infertility, female   . MVP (mitral valve prolapse)     Family History  Problem Relation Age of Onset  . Diabetes Mother   . Hypertension Mother   . Diabetes Father   . Hypertension Father   . Cancer Paternal Aunt        mouth cancer  . Cancer Paternal Uncle        mouth cancer  . Cancer Maternal Grandmother        mouth cancer  . Heart disease Paternal Grandfather   . Bladder Cancer Paternal Aunt 52          . Colon cancer Cousin 4  . Colon polyps Neg Hx   . Esophageal cancer Neg Hx   . Stomach cancer Neg Hx   . Rectal cancer Neg Hx   . Pancreatic cancer Neg Hx   . Prostate cancer Neg Hx     Past Surgical History:  Procedure Laterality Date  . CHOLECYSTECTOMY N/A 09/06/2019   Procedure: LAPAROSCOPIC CHOLECYSTECTOMY, UMBILICIAL HERNIA REPAIR;  Surgeon: Coralie Keens, MD;  Location: [facility];  Service: General;  Laterality: N/A;  . HYSTEROSCOPY    . TONSILECTOMY/ADENOIDECTOMY WITH MYRINGOTOMY    . TONSILLECTOMY     Social History   Occupational History  . Not on file  Tobacco Use  . Smoking status: Former Smoker     Types: Cigarettes    Quit date: 08/22/1994    Years since quitting: 25.3  . Smokeless tobacco: Never Used  Substance and Sexual Activity  . Alcohol use: Yes    Alcohol/week: 5.0 - 7.0 standard drinks    Types: 5 - 7 Standard drinks or equivalent per week  . Drug use: No  . Sexual activity: Not Currently    Birth control/protection: Post-menopausal

## 2020-01-06 IMAGING — US US BREAST*R* LIMITED INC AXILLA
1 series · 5 of 5 positions shown · non-contrast
Comparison: Previous exam(s).

CLINICAL DATA: Recall from screening mammography with
tomosynthesis, possible asymmetry involving the retroareolar RIGHT
breast at MIDDLE depth visible only on CC images.

EXAM:
DIGITAL DIAGNOSTIC RIGHT MAMMOGRAM WITH CAD AND TOMO
ULTRASOUND RIGHT BREAST

[Series 1: us breast*right* limited inc axilla · 0.06mm/px · 5 of 5 slices shown]
[im 1/5]
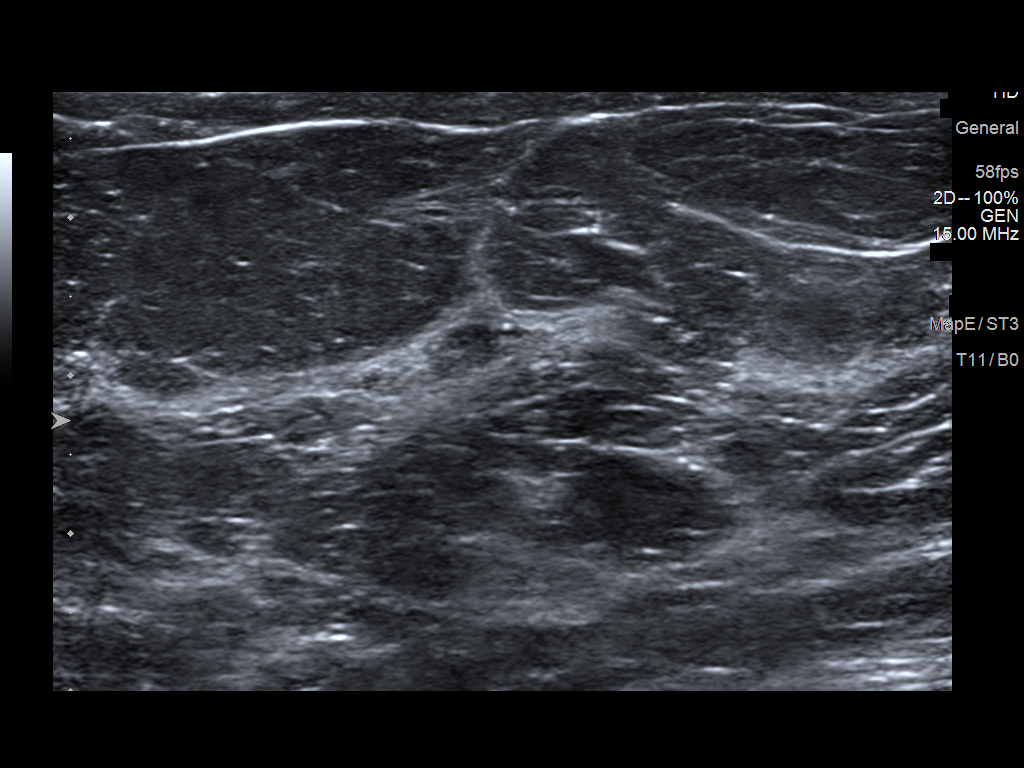
[im 2/5]
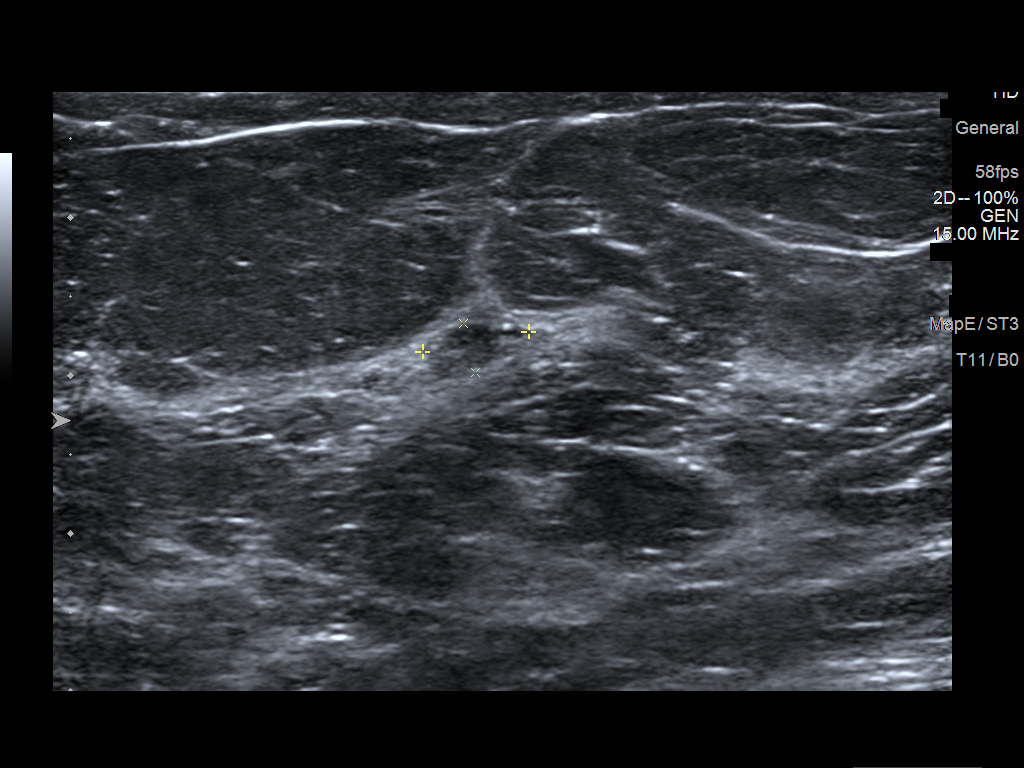
[im 3/5]
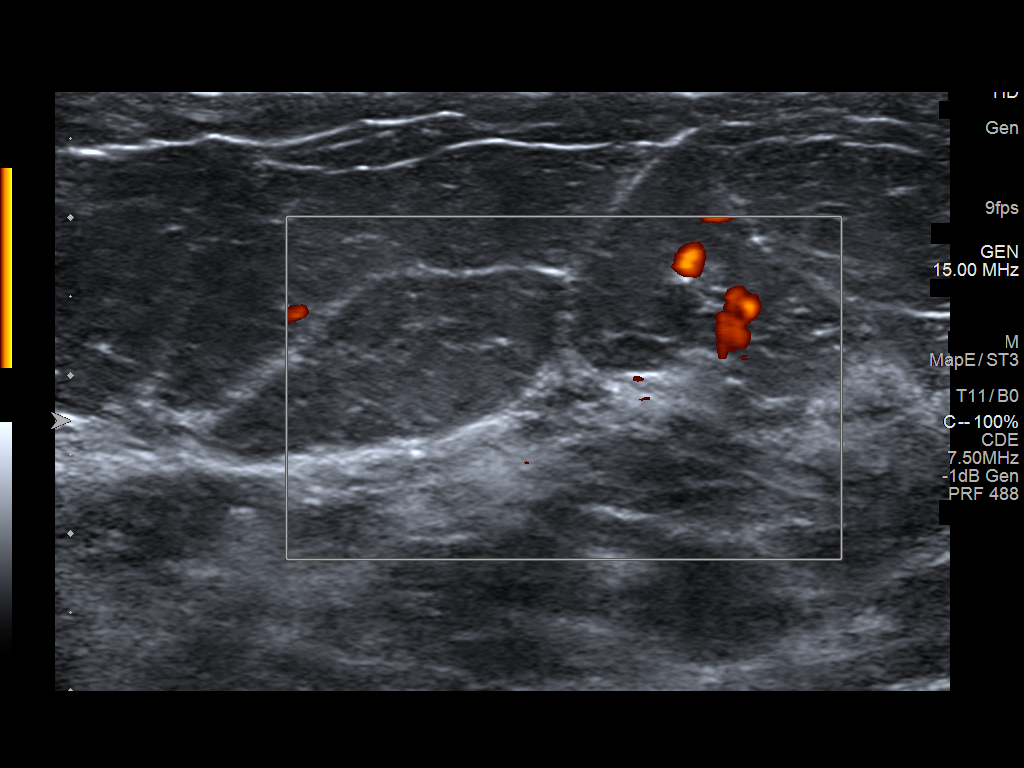
[im 4/5]
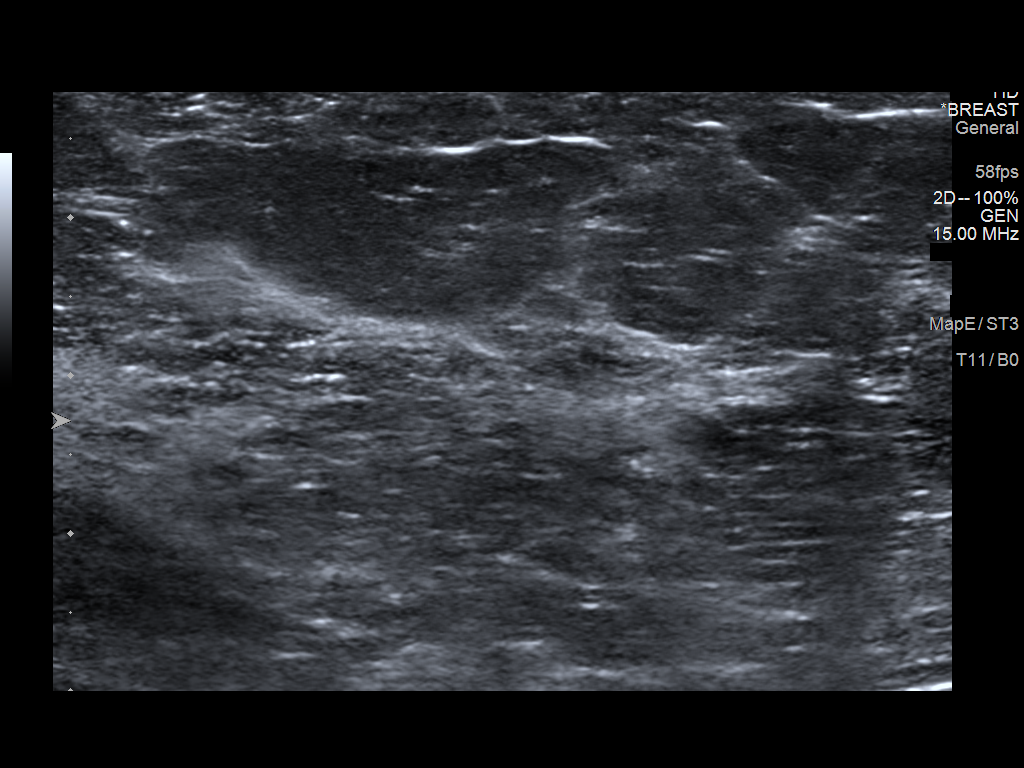
[im 5/5]
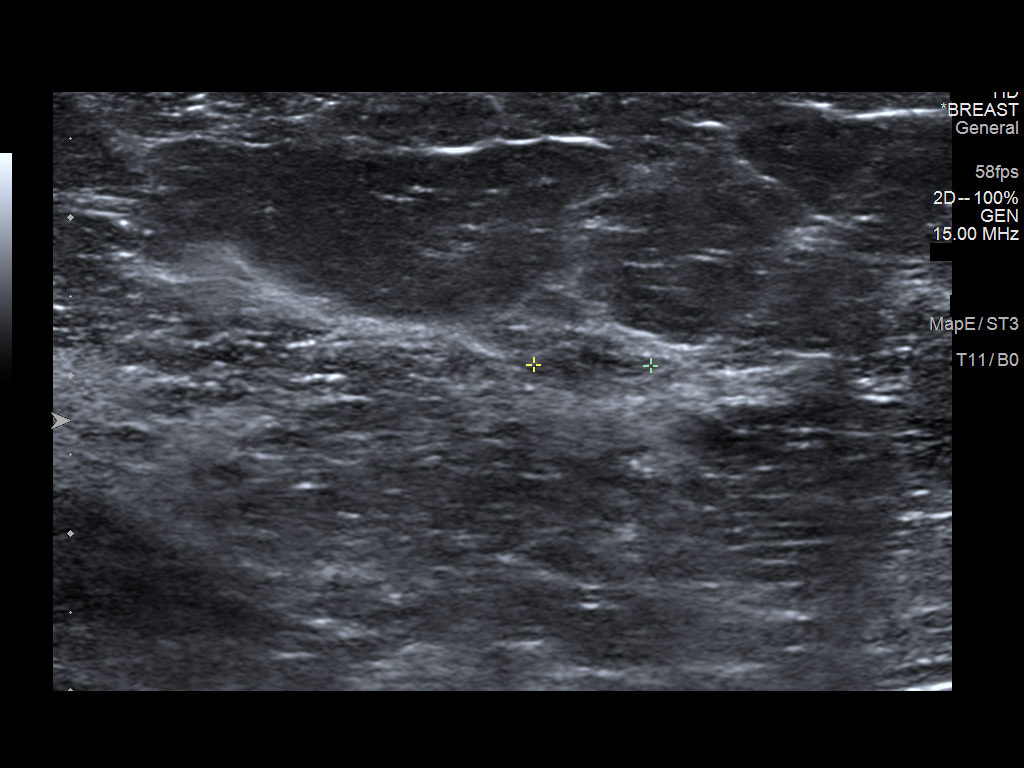

[5 of 5 positions shown; findings below may reference images not displayed]

ACR Breast Density Category b: There are scattered areas of
fibroglandular density.
FINDINGS: Tomosynthesis and synthesized spot-compression CC view of the area
of concern in the RIGHT breast and a tomosynthesis and synthesized
full field mediolateral view of the RIGHT breast were obtained.

Spot compression images demonstrate a persistent asymmetry in the
retroareolar location at MIDDLE depth, and there may be a
low-density mass. There is no associated architectural distortion or
suspicious calcifications. The asymmetry or possible mass localizes
to the UPPER breast on the full field mediolateral images,
indicating a location at or near 12 o'clock.

No suspicious findings elsewhere in the RIGHT breast on the full
field mediolateral images.

The full field mediolateral image was processed with CAD.

On correlative physical exam, there is no palpable abnormality in
the UPPER RIGHT breast.

Targeted RIGHT breast ultrasound is performed, showing an oval
circumscribed parallel heterogeneous, partially cystic mass at the
12:30 o'clock position approximately 4 cm from the nipple at MIDDLE
depth measuring approximately 3 x 7 x 7 mm, demonstrating no
posterior characteristics and no internal power Doppler flow,
correlating with the screening mammographic finding.
IMPRESSION: Likely benign 7 mm mass in the slight UPPER INNER QUADRANT of the
RIGHT breast at the 12:30 o'clock position approximately 4 cm from
the nipple, possibly clustered cysts/apocrine metaplasia.

RECOMMENDATION:
Diagnostic RIGHT mammogram and possible RIGHT breast ultrasound in 6
months.

I have discussed the findings and recommendations with the patient.
If applicable, a reminder letter will be sent to the patient
regarding the next appointment.

BI-RADS CATEGORY  3: Probably benign.

## 2020-01-24 ENCOUNTER — Other Ambulatory Visit: Payer: Self-pay

## 2020-01-25 ENCOUNTER — Ambulatory Visit: Payer: 59 | Admitting: Obstetrics & Gynecology

## 2020-01-25 ENCOUNTER — Telehealth: Payer: Self-pay

## 2020-01-25 ENCOUNTER — Encounter: Payer: Self-pay | Admitting: Obstetrics & Gynecology

## 2020-01-25 VITALS — BP 120/78 | HR 96 | Temp 97.9°F | Resp 10 | Ht 62.5 in | Wt 180.0 lb

## 2020-01-25 DIAGNOSIS — Z01419 Encounter for gynecological examination (general) (routine) without abnormal findings: Secondary | ICD-10-CM

## 2020-01-25 NOTE — Progress Notes (Signed)
64 y.o. G1P0 Widowed White or Caucasian female here for annual exam.  Doing well.  Denies vaginal bleeding.  Had cholecystectomy in October.  This helped pain significantly.  She is having some occasional pain still and it's much better.    Frustrated with weight.  She has started exercising again.  Has a spur on her hip so does have to be careful.  Wants to consider "diet pills".  She has not interested in anything that is going to cost much money.    PCP:  Dr. Inda Merlin.    No LMP recorded. Patient is postmenopausal.          Sexually active: No.  The current method of family planning is post menopausal status.    Exercising: Yes.    balance class and walking Smoker:  no  Health Maintenance: Pap:   09/22/18 Neg:Neg HR HPV  08/19/17 neg    11/16 neg with neg HR HPV History of abnormal Pap:  Yes, ASCUS 2013 MMG:  08/20/19 Right Breast Diagnostic MM/US - BIRADS 3:Probably Benign/density b Colonoscopy:  12/02/17 f/u 5 years BMD:   2016, -1.5.  She wants to wait until she goes on medicare until having this again TDaP:  2018 Pneumonia vaccine(s):  n/a Shingrix:   Zostavax 2017 Hep C testing: 08/16/16 Neg Screening Labs: PCP   reports that she quit smoking about 25 years ago. Her smoking use included cigarettes. She has never used smokeless tobacco. She reports current alcohol use of about 5.0 - 7.0 standard drinks of alcohol per week. She reports that she does not use drugs.  Past Medical History:  Diagnosis Date  . 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 Allergy PCN  . Arthritis   . H/O blood clots    18 months old - blood clots in right leg  . Hx of adenomatous polyp of rectum 02/19/2008  . Hypertension   . Infertility, female   . MVP (mitral valve prolapse)     Past Surgical History:  Procedure Laterality Date  . CHOLECYSTECTOMY N/A 09/06/2019   Procedure: LAPAROSCOPIC CHOLECYSTECTOMY, UMBILICIAL HERNIA REPAIR;  Surgeon: Coralie Keens, MD;  Location: [facility];  Service: General;   Laterality: N/A;  . HYSTEROSCOPY    . TONSILECTOMY/ADENOIDECTOMY WITH MYRINGOTOMY    . TONSILLECTOMY      Current Outpatient Medications  Medication Sig Dispense Refill  . Boswellia Serrata (BOSWELLIA PO) Take 450 mg by mouth daily.     . cetirizine (ZYRTEC) 10 MG tablet Take 10 mg by mouth daily. Aller-Tec    . Cholecalciferol (VITAMIN D-3) 125 MCG (5000 UT) TABS Take 5,000 mg by mouth daily.    . Cyanocobalamin (VITAMIN B-12 PO) Place 1 drop under the tongue daily. Vitamin B-12 Liquid    . diclofenac sodium (VOLTAREN) 1 % GEL Apply 1 g topically 4 (four) times daily as needed (knee pain.).     Marland Kitchen famotidine (PEPCID) 20 MG tablet Take 20 mg by mouth 2 (two) times daily.    . hydrocortisone 2.5 % cream APPLY TO AFFECTED AREA TWICE A DAY    . ibuprofen (ADVIL) 200 MG tablet Take 400 mg by mouth every 8 (eight) hours as needed (pain.).    Marland Kitchen ketoconazole (NIZORAL) 2 % cream APPLY TO AFFECTED AREA TWICE A DAY    . Multiple Vitamins-Minerals (AIRBORNE PO) Take 1 tablet by mouth 2 (two) times a week.     . Multiple Vitamins-Minerals (ULTRA WOMENS PACK PO) Take by mouth.    Marland Kitchen  valsartan-hydrochlorothiazide (DIOVAN-HCT) 80-12.5 MG tablet Take 0.5 tablets by mouth daily.   2  . CALCIUM-MAGNESIUM-VITAMIN D PO Take 15 mLs by mouth daily. Liquid Calcium Magnesium with Vitamin D3 Complex By [employer]     No current facility-administered medications for this visit.    Family History  Problem Relation Age of Onset  . Diabetes Mother   . Hypertension Mother   . Diabetes Father   . Hypertension Father   . Cancer Paternal Aunt        mouth cancer  . Cancer Paternal Uncle        mouth cancer  . Cancer Maternal Grandmother        mouth cancer  . Heart disease Paternal Grandfather   . Bladder Cancer Paternal Aunt 36          . Colon cancer Cousin 27  . Colon polyps Neg Hx   . Esophageal cancer Neg Hx   . Stomach cancer Neg Hx   . Rectal cancer Neg Hx   . Pancreatic cancer Neg Hx   .  Prostate cancer Neg Hx     Review of Systems  All other systems reviewed and are negative.   Exam:   BP 120/78 (BP Location: Right Arm, Patient Position: Sitting, Cuff Size: Normal)   Pulse 96   Temp 97.9 F (36.6 C) (Temporal)   Resp 10   Ht 5' 2.5" (1.588 m)   Wt 180 lb (81.6 kg)   BMI 32.40 kg/m   Height: 5' 2.5" (158.8 cm)  Ht Readings from Last 3 Encounters:  01/25/20 5' 2.5" (1.588 m)  09/06/19 5\' 4"  (1.626 m)  09/03/19 5\' 4"  (1.626 m)    General appearance: alert, cooperative and appears stated age Head: Normocephalic, without obvious abnormality, atraumatic Neck: no adenopathy, supple, symmetrical, trachea midline and thyroid normal to inspection and palpation Lungs: clear to auscultation bilaterally Breasts: normal appearance, no masses or tenderness Heart: regular rate and rhythm Abdomen: soft, non-tender; bowel sounds normal; no masses,  no organomegaly Extremities: extremities normal, atraumatic, no cyanosis or edema Skin: Skin color, texture, turgor normal. No rashes or lesions Lymph nodes: Cervical, supraclavicular, and axillary nodes normal. No abnormal inguinal nodes palpated Neurologic: Grossly normal   Pelvic: External genitalia:  no lesions              Urethra:  normal appearing urethra with no masses, tenderness or lesions              Bartholins and Skenes: normal                 Vagina: normal appearing vagina with normal color and discharge, no lesions              Cervix: no lesions              Pap taken: No. Bimanual Exam:  Uterus:  normal size, contour, position, consistency, mobility, non-tender              Adnexa: normal adnexa and no mass, fullness, tenderness               Rectovaginal: Confirms               Anus:  normal sphincter tone, no lesions  Chaperone, Terence Lux, CMA, was present for exam.  A:  Well Woman with normal exam PMP, no HRT Elevated lipids, ACC/AHA risk calculator for next 10 years was 5.0% Family hx of  colon cancer, hyperplastic polyp hx in  pt Family hx of bladder cancer H/o CIN 1 2021 Skin rash, has seen dermatology  P:   Mammogram guidelines reviewed  pap smear with neg HR HPV 09/2018 Colonoscopy due every 5 years Shingles vaccines discussed Will plan lab work with Dr. Inda Merlin.  Has appt next year.   Return annually or prn

## 2020-02-19 ENCOUNTER — Other Ambulatory Visit: Payer: Self-pay

## 2020-02-19 ENCOUNTER — Ambulatory Visit: Payer: No Typology Code available for payment source

## 2020-02-19 ENCOUNTER — Ambulatory Visit
Admission: RE | Admit: 2020-02-19 | Discharge: 2020-02-19 | Disposition: A | Discharge status: Home / Self Care | Payer: 59 | Source: Ambulatory Visit | Attending: Obstetrics & Gynecology | Admitting: Obstetrics & Gynecology

## 2020-02-19 DIAGNOSIS — N6310 Unspecified lump in the right breast, unspecified quadrant: Secondary | ICD-10-CM

## 2020-07-02 ENCOUNTER — Ambulatory Visit (INDEPENDENT_AMBULATORY_CARE_PROVIDER_SITE_OTHER): Payer: 59 | Admitting: Orthopaedic Surgery

## 2020-07-02 ENCOUNTER — Encounter: Payer: Self-pay | Admitting: Orthopaedic Surgery

## 2020-07-02 DIAGNOSIS — M7062 Trochanteric bursitis, left hip: Secondary | ICD-10-CM

## 2020-07-02 MED ORDER — METHYLPREDNISOLONE ACETATE 40 MG/ML IJ SUSP
40.0000 mg | INTRAMUSCULAR | Status: AC | PRN
  Administered 2020-07-02: 40 mg via INTRA_ARTICULAR

## 2020-07-02 MED ORDER — LIDOCAINE HCL 1 % IJ SOLN
3.0000 mL | INTRAMUSCULAR | Status: AC | PRN
  Administered 2020-07-02: 3 mL

## 2020-07-02 NOTE — Progress Notes (Signed)
Office Visit Note   Patient: Faith Galvan           Date of Birth: [birth year]           MRN: [id number] Visit Date: 07/02/2020              Requested by: Josetta Huddle, MD [address] [place],  [place] [address] PCP: Josetta Huddle, MD   Assessment & Plan: Visit Diagnoses:  1. Trochanteric bursitis, left hip     Plan: She will continue IT band stretching.  She knows to wait at least 3 months between injections.  Questions were encouraged and answered at length.  Follow-Up Instructions: Return if symptoms worsen or fail to improve.   Orders:  Orders Placed This Encounter  Procedures  . Large Joint Inj   No orders of the defined types were placed in this encounter.     Procedures: Large Joint Inj: L greater trochanter on 07/02/2020 9:05 AM Indications: pain Details: 22 G 1.5 in needle, lateral approach  Arthrogram: No  Medications: 3 mL lidocaine 1 %; 40 mg methylPREDNISolone acetate 40 MG/ML Outcome: tolerated well, no immediate complications Procedure, treatment alternatives, risks and benefits explained, specific risks discussed. Consent was given by the patient. Immediately prior to procedure a time out was called to verify the correct patient, procedure, equipment, support staff and site/side marked as required. Patient was prepped and draped in the usual sterile fashion.       Clinical Data: No additional findings.   Subjective: Chief Complaint  Patient presents with  . Left Hip - Pain    HPI Faith Galvan comes in today due to lateral left hip pain.  She was last seen in January at that time was given an injection for trochanteric bursitis and states this did well until recently.  She denies any numbness tingling down the leg.  States she is having no groin pain.  Pain does awaken her at night.  She also notes that she rides in the car for long period time she has pain in the lateral aspect of the hip.  Review of Systems See HPI  otherwise negative  Objective: Vital Signs: There were no vitals taken for this visit.  Physical Exam Constitutional:      Appearance: She is not ill-appearing or diaphoretic.  Pulmonary:     Effort: Pulmonary effort is normal.  Neurological:     Mental Status: She is alert and oriented to person, place, and time.  Psychiatric:        Mood and Affect: Mood normal.     Ortho Exam Bilateral hips excellent range of motion without pain.  Tenderness over the left hip only over the trochanteric region. Specialty Comments:  No specialty comments available.  Imaging: No results found.   PMFS History: Patient Active Problem List   Diagnosis Date Noted  . S/P laparoscopic cholecystectomy 09/06/2019  . Overweight with body mass index (BMI) 25.0-29.9 03/15/2018  . Osteopenia 03/15/2018  . Intolerant of heat 03/15/2018  . Low back pain 03/15/2018  . Menopausal symptom 03/15/2018  . Urinary tract infectious disease 03/15/2018  . Sprain of ankle 06/24/2016  . Avulsion of right ankle 02/24/2016  . Toe pain, left 08/26/2015  . 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 Hallux limitus of left foot 08/26/2015  . Abnormal Pap smear of cervix 08/31/2012  . Abnormal Pap smear, atypical squamous cells of undetermined sign (ASC-US) 02/24/2012  . Onychomycosis due to  dermatophyte 08/28/2009  . Disorder of bone and cartilage 10/01/2008  . Allergic rhinitis 09/06/2008  . Benign neoplasm of colon 09/06/2008  . Hx of adenomatous polyp of rectum 02/19/2008   Past Medical History:  Diagnosis Date  . 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 Allergy PCN  . Arthritis   . H/O blood clots    18 months old - blood clots in right leg  . Hx of adenomatous polyp of rectum 02/19/2008  . Hypertension   . Infertility, female   . MVP (mitral valve prolapse)     Family History  Problem Relation Age of Onset  . Diabetes Mother   . Hypertension Mother   . Diabetes Father   .  Hypertension Father   . Cancer Paternal Aunt        mouth cancer  . Cancer Paternal Uncle        mouth cancer  . Cancer Maternal Grandmother        mouth cancer  . Heart disease Paternal Grandfather   . Bladder Cancer Paternal Aunt 69          . Colon cancer Cousin 66  . Colon polyps Neg Hx   . Esophageal cancer Neg Hx   . Stomach cancer Neg Hx   . Rectal cancer Neg Hx   . Pancreatic cancer Neg Hx   . Prostate cancer Neg Hx     Past Surgical History:  Procedure Laterality Date  . CHOLECYSTECTOMY N/A 09/06/2019   Procedure: LAPAROSCOPIC CHOLECYSTECTOMY, UMBILICIAL HERNIA REPAIR;  Surgeon: Coralie Keens, MD;  Location: [facility];  Service: General;  Laterality: N/A;  . HYSTEROSCOPY    . TONSILECTOMY/ADENOIDECTOMY WITH MYRINGOTOMY     Social History   Occupational History  . Not on file  Tobacco Use  . Smoking status: Former Smoker    Types: Cigarettes    Quit date: 08/22/1994    Years since quitting: 25.8  . Smokeless tobacco: Never Used  Vaping Use  . Vaping Use: Never used  Substance and Sexual Activity  . Alcohol use: Yes    Alcohol/week: 5.0 - 7.0 standard drinks    Types: 5 - 7 Standard drinks or equivalent per week  . Drug use: No  . Sexual activity: Not Currently    Birth control/protection: Post-menopausal

## 2020-07-15 ENCOUNTER — Other Ambulatory Visit: Payer: Self-pay | Admitting: Obstetrics & Gynecology

## 2020-07-15 DIAGNOSIS — Z1231 Encounter for screening mammogram for malignant neoplasm of breast: Secondary | ICD-10-CM

## 2020-08-14 NOTE — Telephone Encounter (Signed)
Telephone note not needed.

## 2020-08-19 ENCOUNTER — Other Ambulatory Visit: Payer: Self-pay

## 2020-08-19 ENCOUNTER — Ambulatory Visit
Admission: RE | Admit: 2020-08-19 | Discharge: 2020-08-19 | Disposition: A | Discharge status: Home / Self Care | Payer: 59 | Source: Ambulatory Visit | Attending: Obstetrics & Gynecology | Admitting: Obstetrics & Gynecology

## 2020-08-19 DIAGNOSIS — Z1231 Encounter for screening mammogram for malignant neoplasm of breast: Secondary | ICD-10-CM

## 2021-01-13 ENCOUNTER — Other Ambulatory Visit: Payer: Self-pay

## 2021-01-13 ENCOUNTER — Ambulatory Visit (INDEPENDENT_AMBULATORY_CARE_PROVIDER_SITE_OTHER): Payer: Medicare Other | Admitting: Orthopaedic Surgery

## 2021-01-13 ENCOUNTER — Encounter: Payer: Self-pay | Admitting: Orthopaedic Surgery

## 2021-01-13 DIAGNOSIS — M7062 Trochanteric bursitis, left hip: Secondary | ICD-10-CM

## 2021-01-13 DIAGNOSIS — M25561 Pain in right knee: Secondary | ICD-10-CM | POA: Diagnosis not present

## 2021-01-13 DIAGNOSIS — M25552 Pain in left hip: Secondary | ICD-10-CM

## 2021-01-13 MED ORDER — METHYLPREDNISOLONE ACETATE 40 MG/ML IJ SUSP
40.0000 mg | INTRAMUSCULAR | Status: AC | PRN
  Administered 2021-01-13: 40 mg via INTRA_ARTICULAR

## 2021-01-13 MED ORDER — MELOXICAM 15 MG PO TABS: 15.0000 mg | ORAL_TABLET | Freq: Every day | ORAL | 1 refills | Status: DC

## 2021-01-13 MED ORDER — LIDOCAINE HCL 1 % IJ SOLN
3.0000 mL | INTRAMUSCULAR | Status: AC | PRN
  Administered 2021-01-13: 3 mL

## 2021-01-13 NOTE — Progress Notes (Signed)
Office Visit Note   Patient: Faith Galvan           Date of Birth: [birth year]           MRN: [id number] Visit Date: 01/13/2021              Requested by: Josetta Huddle, MD [address] [place],  [place] [address] PCP: Josetta Huddle, MD   Assessment & Plan: Visit Diagnoses:  1. Trochanteric bursitis, left hip   2. Acute pain of right knee     Plan: Per the patient's request I did place a steroid injection over the left hip trochanteric area.  She tolerated this well.  I will start her on meloxicam as an anti-inflammatory and would like to send her to formal physical therapy to have them work on her left hip and right hip as well as her knees and potentially sciatica as well.  All questions and concerns were answered and addressed.  We will see her back after course of formal physical therapy.  Follow-Up Instructions: Return in about 6 weeks (around 02/24/2021).   Orders:  Orders Placed This Encounter  Procedures  . Large Joint Inj   Meds ordered this encounter  Medications  . meloxicam (MOBIC) 15 MG tablet    Sig: Take 1 tablet (15 mg total) by mouth daily.    Dispense:  30 tablet    Refill:  1      Procedures: Large Joint Inj: L greater trochanter on 01/13/2021 9:19 AM Indications: pain and diagnostic evaluation Details: 22 G 1.5 in needle, lateral approach  Arthrogram: No  Medications: 3 mL lidocaine 1 %; 40 mg methylPREDNISolone acetate 40 MG/ML Outcome: tolerated well, no immediate complications Procedure, treatment alternatives, risks and benefits explained, specific risks discussed. Consent was given by the patient. Immediately prior to procedure a time out was called to verify the correct patient, procedure, equipment, support staff and site/side marked as required. Patient was prepped and draped in the usual sterile fashion.       Clinical Data: No additional findings.   Subjective: Chief Complaint  Patient presents with  . Left  Hip - Pain  The patient is well-known to [place].  She has been dealing with left hip trochanteric bursitis for some time now.  It comes and goes and steroid injections have helped quite a bit.  She is very active and will be 65 this month.  She is only recently developed some right hip pain as well as some right knee pain.  She has had no other acute change in her medical status.  She is not diabetic.  She does not take any anti-inflammatories regularly.  She denies groin pain on the left side.  She is requesting a left hip steroid injection today.  Is been well over 6 months since her last injection  HPI  Review of Systems She currently has a headache, chest pain, shortness of breath, fever, chills, nausea, vomiting  Objective: Vital Signs: There were no vitals taken for this visit.  Physical Exam She is alert and orient x3 and in no acute distress Ortho Exam Examination of her left hip shows a move smoothly and fluidly.  Her right hip also moves smoothly and fluidly.  There is no pain over the trochanteric area on the right side but some in the sciatic area on the right side.  There is pain to palpation over the left hip trochanteric area.  She has full range of motion  of her right knee but some medial joint line tenderness with no effusion. Specialty Comments:  No specialty comments available.  Imaging: No results found.   PMFS History: Patient Active Problem List   Diagnosis Date Noted  . S/P laparoscopic cholecystectomy 09/06/2019  . Overweight with body mass index (BMI) 25.0-29.9 03/15/2018  . Osteopenia 03/15/2018  . Intolerant of heat 03/15/2018  . Low back pain 03/15/2018  . Menopausal symptom 03/15/2018  . Urinary tract infectious disease 03/15/2018  . Sprain of ankle 06/24/2016  . Avulsion of right ankle 02/24/2016  . Toe pain, left 08/26/2015  . 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 Hallux limitus of left foot 08/26/2015  . Abnormal Pap smear of  cervix 08/31/2012  . Abnormal Pap smear, atypical squamous cells of undetermined sign (ASC-US) 02/24/2012  . Onychomycosis due to dermatophyte 08/28/2009  . Disorder of bone and cartilage 10/01/2008  . Allergic rhinitis 09/06/2008  . Benign neoplasm of colon 09/06/2008  . Hx of adenomatous polyp of rectum 02/19/2008   Past Medical History:  Diagnosis Date  . 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 Allergy PCN  . Arthritis   . H/O blood clots    18 months old - blood clots in right leg  . Hx of adenomatous polyp of rectum 02/19/2008  . Hypertension   . Infertility, female   . MVP (mitral valve prolapse)     Family History  Problem Relation Age of Onset  . Diabetes Mother   . Hypertension Mother   . Diabetes Father   . Hypertension Father   . Cancer Paternal Aunt        mouth cancer  . Cancer Paternal Uncle        mouth cancer  . Cancer Maternal Grandmother        mouth cancer  . Heart disease Paternal Grandfather   . Bladder Cancer Paternal Aunt 54          . Colon cancer Cousin 73  . Colon polyps Neg Hx   . Esophageal cancer Neg Hx   . Stomach cancer Neg Hx   . Rectal cancer Neg Hx   . Pancreatic cancer Neg Hx   . Prostate cancer Neg Hx     Past Surgical History:  Procedure Laterality Date  . CHOLECYSTECTOMY N/A 09/06/2019   Procedure: LAPAROSCOPIC CHOLECYSTECTOMY, UMBILICIAL HERNIA REPAIR;  Surgeon: Coralie Keens, MD;  Location: [facility];  Service: General;  Laterality: N/A;  . HYSTEROSCOPY    . TONSILECTOMY/ADENOIDECTOMY WITH MYRINGOTOMY     Social History   Occupational History  . Not on file  Tobacco Use  . Smoking status: Former Smoker    Types: Cigarettes    Quit date: 08/22/1994    Years since quitting: 26.4  . Smokeless tobacco: Never Used  Vaping Use  . Vaping Use: Never used  Substance and Sexual Activity  . Alcohol use: Yes    Alcohol/week: 5.0 - 7.0 standard drinks    Types: 5 - 7 Standard drinks or equivalent per week  . Drug use: No  .  Sexual activity: Not Currently    Birth control/protection: Post-menopausal

## 2021-01-15 ENCOUNTER — Other Ambulatory Visit: Payer: Self-pay | Admitting: Internal Medicine

## 2021-01-15 ENCOUNTER — Ambulatory Visit
Admission: RE | Admit: 2021-01-15 | Discharge: 2021-01-15 | Disposition: A | Discharge status: Home / Self Care | Payer: Medicare Other | Source: Ambulatory Visit | Attending: Internal Medicine | Admitting: Internal Medicine

## 2021-01-15 DIAGNOSIS — R0600 Dyspnea, unspecified: Secondary | ICD-10-CM

## 2021-01-15 DIAGNOSIS — R0609 Other forms of dyspnea: Secondary | ICD-10-CM

## 2021-01-26 ENCOUNTER — Ambulatory Visit (INDEPENDENT_AMBULATORY_CARE_PROVIDER_SITE_OTHER): Payer: Medicare Other | Admitting: Rehabilitative and Restorative Service Providers"

## 2021-01-26 ENCOUNTER — Encounter: Payer: Self-pay | Admitting: Rehabilitative and Restorative Service Providers"

## 2021-01-26 ENCOUNTER — Other Ambulatory Visit: Payer: Self-pay

## 2021-01-26 DIAGNOSIS — M25552 Pain in left hip: Secondary | ICD-10-CM

## 2021-01-26 DIAGNOSIS — M25551 Pain in right hip: Secondary | ICD-10-CM

## 2021-01-26 DIAGNOSIS — M6281 Muscle weakness (generalized): Secondary | ICD-10-CM | POA: Diagnosis not present

## 2021-01-26 DIAGNOSIS — R262 Difficulty in walking, not elsewhere classified: Secondary | ICD-10-CM

## 2021-01-26 DIAGNOSIS — M25561 Pain in right knee: Secondary | ICD-10-CM

## 2021-01-26 NOTE — Patient Instructions (Signed)
Access Code: [email or web address] URL: [email or web address] Date: 01/26/2021 Prepared by: Scot Jun  Exercises Clamshell - 2 x daily - 7 x weekly - 3 sets - 10 reps Supine Bridge - 2 x daily - 7 x weekly - 3 sets - 10 reps - 2 hold Supine Piriformis Stretch Pulling Heel to Hip - 2 x daily - 7 x weekly - 3 sets - 10 reps Seated Straight Leg Heel Taps - 2 x daily - 7 x weekly - 3 sets - 10 reps Sit to Stand - 1 x daily - 7 x weekly - 3 sets - 10 reps

## 2021-01-26 NOTE — Therapy (Signed)
[facility] [address] [place], [place], [address] Phone: [phone]   Fax:  [phone]  Physical Therapy Evaluation  Patient Details  Name: Faith Galvan MRN: [id number] Date of Birth: [birth year] Referring Provider (PT): Dr. Ninfa Linden   Encounter Date: 01/26/2021   PT End of Session - 01/26/21 0917    Visit Number 1    Number of Visits 20    Date for PT Re-Evaluation 04/06/21    Authorization Type Medicare 10 weeks POC, KX Modifier 15 visits    Authorization - Visit Number 1    Progress Note Due on Visit 10    PT Start Time 0930    PT Stop Time 1010    PT Time Calculation (min) 40 min    Activity Tolerance Patient tolerated treatment well    Behavior During Therapy [facility] for tasks assessed/performed           Past Medical History:  Diagnosis Date  . 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 Allergy PCN  . Arthritis   . H/O blood clots    18 months old - blood clots in right leg  . Hx of adenomatous polyp of rectum 02/19/2008  . Hypertension   . Infertility, female   . MVP (mitral valve prolapse)     Past Surgical History:  Procedure Laterality Date  . CHOLECYSTECTOMY N/A 09/06/2019   Procedure: LAPAROSCOPIC CHOLECYSTECTOMY, UMBILICIAL HERNIA REPAIR;  Surgeon: Coralie Keens, MD;  Location: [place];  Service: General;  Laterality: N/A;  . HYSTEROSCOPY    . TONSILECTOMY/ADENOIDECTOMY WITH MYRINGOTOMY      There were no vitals filed for this visit.    Subjective Assessment - 01/26/21 0923    Subjective Pt. stated complaints of bilateral hip pain, Rt knee pain.  Pt. stated hip pain occurs c prolonged sitting, riding a bike.  Pt. stated complaints in Lt hip in posterior/lateral area and Rt groin area. Pt. stated Rt knee sore in bed lying on rt side. Pt. stated knee pain is getting some better but still notices with going down stairs.  Pt. stated routine of trying to exercise at [facility] and ride a bike at home     Pertinent History Injection Lt hip.    Limitations Walking;Standing;Sitting    Patient Stated Goals Reduce pain, yard work, exercise    Currently in Pain? Yes    Pain Score 5    at worst 7/10   Pain Location Hip    Pain Orientation Left;Right    Pain Descriptors / Indicators Numbness;Throbbing    Pain Onset More than a month ago   Dec 2021 Lt hip, Rt hip comes and goes   Pain Frequency Constant    Aggravating Factors  sitting prolonged, bike riding    Pain Relieving Factors injection at times, ice/heat    Multiple Pain Sites Yes    Pain Location Knee    Pain Orientation Right    Pain Descriptors / Indicators Aching    Pain Type Chronic pain    Pain Onset More than a month ago   acute on chronic   Pain Frequency Intermittent    Aggravating Factors  going down stairs, walking, lying in bed    Pain Relieving Factors avoiding painful activity, ice/heat              [facility] PT Assessment - 01/26/21 0001      Assessment   Medical Diagnosis Bilateral hip pain, Rt knee pain  Referring Provider (PT) Dr. Ninfa Linden    Onset Date/Surgical Date 10/15/20    Hand Dominance Right      Precautions   Precautions None      Restrictions   Weight Bearing Restrictions No      Balance Screen   Has the patient fallen in the past 6 months No    Has the patient had a decrease in activity level because of a fear of falling?  No    Is the patient reluctant to leave their home because of a fear of falling?  No      Home Environment   Living Environment Private residence    Living Arrangements Alone    Additional Comments Might move to house with flight of stairs.  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  Rt knee AROM WFL s symptoms today    AROM Assessment Site Lumbar;Hip;Knee    Right/Left Hip Left;Right    Right/Left Knee Left;Right    Lumbar Flexion movement to ankle, no complaints    Lumbar Extension 50%, no change in symptoms    Lumbar - Right Side Bend knee jt no complaints    Lumbar - Left Side Bend knee jt no complaints      PROM   Overall PROM Comments DF in knee extension 0 deg WFL bilateral    PROM Assessment Site Hip    Right/Left Hip Left;Right    Right Hip External Rotation  25   pain anterior hip   Right Hip Internal Rotation  48   pain anterior Rt hip   Left Hip External Rotation  50    Left Hip Internal Rotation  38      Strength   Strength Assessment Site Hip;Knee;Ankle    Right/Left Hip Right;Left    Right Hip Flexion 5/5    Right Hip Extension 4/5    Right Hip ABduction 4/5    Left Hip Flexion 5/5    Left Hip Extension 4/5    Left Hip ABduction 3+/5    Right/Left Knee Right;Left    Right Knee Flexion 5/5    Right Knee Extension 4/5    Left Knee Flexion 5/5    Left Knee Extension 5/5    Right/Left Ankle Left;Right    Right Ankle Dorsiflexion 5/5    Left Ankle Dorsiflexion 5/5      Flexibility   Soft Tissue Assessment /Muscle Length yes    Hamstrings 70 Rt LE, 90 deg Lt LE      Palpation   Palpation comment TrP noted  in [place] med/max c concordant symptoms noted      Ambulation/Gait   Gait Comments Independent ambulation                      Objective measurements completed on examination: See above  findings.       [place] Adult PT Treatment/Exercise - 01/26/21 0001      Self-Care   Self-Care Other Self-Care Comments    Other Self-Care Comments  Review of pain management model for activity with avoiding exercise 5/10 or greater with adaptations for mild pain complaints (reduced effort, stretching distance etc).      Exercises   Exercises Other Exercises;Knee/Hip    Other Exercises  HEP instruction and cues c demonstration/trial set of each per handout.  HEP consisting of spine bridge, sidelying clam shell bilateral, supine piriformis figure 4 stretch, seated SLR, sit to stand                  PT Education - 01/26/21 1016    Education Details HEP, POC, DN    Person(s) Educated Patient    Methods Demonstration;Explanation;Verbal cues;Handout    Comprehension Returned demonstration;Verbalized understanding            PT Short Term Goals - 01/26/21 0920      PT SHORT TERM GOAL #1   Title Patient will demonstrate independent use of home exercise program to maintain progress from in clinic treatments.    Time 3    Period Weeks    Status New    Target Date 02/16/21             PT Long Term Goals - 01/26/21 0920      PT LONG TERM GOAL #1   Title Patient will demonstrate/report pain at worst less than or equal to 2/10 to facilitate minimal limitation in daily activity secondary to pain symptoms.    Time 10    Period Weeks    Status New    Target Date 04/06/21      PT LONG TERM GOAL #2   Title Patient will demonstrate independent use of home exercise program to facilitate ability to maintain/progress functional gains from skilled physical therapy services.    Time 10    Period Weeks    Status New    Target Date 04/06/21      PT LONG TERM GOAL #3   Title Pt. will demonstrate bilateral LE MMT 5/5 throughout s symptoms to facilitate usual standing, walking at PLOF s limitation.    Time 10    Period Weeks    Status New    Target Date 04/06/21      PT LONG  TERM GOAL #4   Title Pt. will demonstrate bilateral hip AROM WFL s symptoms to facilitate usual activity at PLOF.    Time 10    Period Weeks    Status New    Target Date 04/06/21      PT LONG TERM GOAL #5   Title Pt. will demonstrate FOTO > or = 59% to indicate reduced disability 2' to condition.    Time 10    Period Weeks    Status New    Target Date 04/06/21      Additional Long Term Goals   Additional Long Term Goals Yes      PT LONG TERM GOAL #6   Title Pt. will demonstrate bialteral SLS 15 seconds s trendelenburg to facilitate improved stability in ambulation.  Time 10    Period Weeks    Status New    Target Date 04/06/21                  Plan - 01/26/21 4401    Clinical Impression Statement Patient is a 65 y.o. female who comes to clinic with complaints of bilateral hip pain, Rt knee pain with mobility, strength and movement coordination deficits that impair their ability to perform usual daily and recreational functional activities without increase difficulty/symptoms at this time.  Patient to benefit from skilled PT services to address impairments and limitations to improve to previous level of function without restriction secondary to condition.    Personal Factors and Comorbidities Comorbidity 1    Comorbidities HTN    Examination-Activity Limitations Sit;Squat;Stairs;Stand;Locomotion Level    Examination-Participation Restrictions Community Activity;Yard Work    Stability/Clinical Decision Making Stable/Uncomplicated    Designer, jewellery Low    Rehab Potential Good    PT Frequency 2x / week    PT Duration Other (comment)   10 weeks   PT Treatment/Interventions ADLs/Self Care Home Management;Cryotherapy;Electrical Stimulation;Iontophoresis 4mg /ml Dexamethasone;Moist Heat;Traction;Balance training;Therapeutic exercise;Therapeutic activities;Functional mobility training;Stair training;Gait training;Patient/family education;DME  Instruction;Ultrasound;Neuromuscular re-education;Manual techniques;Taping;Passive range of motion;Dry needling;Spinal Manipulations;Joint Manipulations    PT Next Visit Plan Review existing HEP, Rt hip jt mobs for ROM, possible Lt hip DN, LE strengthening    PT Home Exercise Plan [phone]    Consulted and Agree with Plan of Care Patient           Patient will benefit from skilled therapeutic intervention in order to improve the following deficits and impairments:  Hypomobility,Pain,Increased fascial restricitons,Decreased strength,Decreased activity tolerance,Difficulty walking,Impaired perceived functional ability,Decreased range of motion,Decreased balance,Decreased mobility,Impaired flexibility,Decreased coordination  Visit Diagnosis: Pain in left hip  Pain in right hip  Muscle weakness (generalized)  Right knee pain, unspecified chronicity  Difficulty in walking, not elsewhere classified     Problem List Patient Active Problem List   Diagnosis Date Noted  . S/P laparoscopic cholecystectomy 09/06/2019  . Overweight with body mass index (BMI) 25.0-29.9 03/15/2018  . Osteopenia 03/15/2018  . Intolerant of heat 03/15/2018  . Low back pain 03/15/2018  . Menopausal symptom 03/15/2018  . Urinary tract infectious disease 03/15/2018  . Sprain of ankle 06/24/2016  . Avulsion of right ankle 02/24/2016  . Toe pain, left 08/26/2015  . 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 Hallux limitus of left foot 08/26/2015  . Abnormal Pap smear of cervix 08/31/2012  . Abnormal Pap smear, atypical squamous cells of undetermined sign (ASC-US) 02/24/2012  . Onychomycosis due to dermatophyte 08/28/2009  . Disorder of bone and cartilage 10/01/2008  . Allergic rhinitis 09/06/2008  . Benign neoplasm of colon 09/06/2008  . Hx of adenomatous polyp of rectum 02/19/2008    Scot Jun, PT, DPT, OCS, ATC 01/26/21  10:18 AM    [facility] [address] [place], [place], [address] Phone: [phone]   Fax:  [phone]  Name: Faith Galvan MRN: [id number] Date of Birth: Dec 31, 1955

## 2021-02-03 ENCOUNTER — Other Ambulatory Visit (HOSPITAL_COMMUNITY)
Admission: RE | Admit: 2021-02-03 | Discharge: 2021-02-03 | Disposition: A | Discharge status: Home / Self Care | Payer: Medicare Other | Source: Ambulatory Visit | Attending: Obstetrics & Gynecology | Admitting: Obstetrics & Gynecology

## 2021-02-03 ENCOUNTER — Other Ambulatory Visit (HOSPITAL_BASED_OUTPATIENT_CLINIC_OR_DEPARTMENT_OTHER): Payer: Self-pay | Admitting: Obstetrics & Gynecology

## 2021-02-03 ENCOUNTER — Encounter (HOSPITAL_BASED_OUTPATIENT_CLINIC_OR_DEPARTMENT_OTHER): Payer: Self-pay | Admitting: Obstetrics & Gynecology

## 2021-02-03 ENCOUNTER — Ambulatory Visit (INDEPENDENT_AMBULATORY_CARE_PROVIDER_SITE_OTHER): Payer: Medicare Other | Admitting: Obstetrics & Gynecology

## 2021-02-03 ENCOUNTER — Other Ambulatory Visit: Payer: Self-pay

## 2021-02-03 VITALS — BP 140/86 | HR 75 | Resp 18 | Ht 62.5 in | Wt 189.0 lb

## 2021-02-03 DIAGNOSIS — T148XXA Other injury of unspecified body region, initial encounter: Secondary | ICD-10-CM

## 2021-02-03 DIAGNOSIS — Z86718 Personal history of other venous thrombosis and embolism: Secondary | ICD-10-CM

## 2021-02-03 DIAGNOSIS — Z87410 Personal history of cervical dysplasia: Secondary | ICD-10-CM

## 2021-02-03 DIAGNOSIS — E2839 Other primary ovarian failure: Secondary | ICD-10-CM

## 2021-02-03 DIAGNOSIS — E673 Hypervitaminosis D: Secondary | ICD-10-CM

## 2021-02-03 DIAGNOSIS — M899 Disorder of bone, unspecified: Secondary | ICD-10-CM | POA: Diagnosis not present

## 2021-02-03 DIAGNOSIS — Z124 Encounter for screening for malignant neoplasm of cervix: Secondary | ICD-10-CM

## 2021-02-03 DIAGNOSIS — Z01419 Encounter for gynecological examination (general) (routine) without abnormal findings: Secondary | ICD-10-CM | POA: Diagnosis not present

## 2021-02-03 DIAGNOSIS — I10 Essential (primary) hypertension: Secondary | ICD-10-CM | POA: Insufficient documentation

## 2021-02-03 DIAGNOSIS — Z6834 Body mass index (BMI) 34.0-34.9, adult: Secondary | ICD-10-CM | POA: Diagnosis not present

## 2021-02-03 DIAGNOSIS — Z9189 Other specified personal risk factors, not elsewhere classified: Secondary | ICD-10-CM

## 2021-02-03 DIAGNOSIS — Z1231 Encounter for screening mammogram for malignant neoplasm of breast: Secondary | ICD-10-CM

## 2021-02-03 NOTE — Patient Instructions (Signed)
[facility] [place], [place], [place] [address]  Phone: [phone]

## 2021-02-03 NOTE — Progress Notes (Signed)
65 y.o. G69P0010 Widowed White or Caucasian female here for breast and pelvic exam.  Denies vaginal bleeding.  Pt frustrated with weight.  BMI now 34.  Discussed with Dr. Inda Merlin.  Reports she was told to eat one meal a day.  Feels like her eating isn't that bad.  Exercising.  We discussed this last year and she is ready now for proceeding with a program for weight loss.  [facility] discussed.  Had lab work done 01/2021 with Dr. Inda Merlin.  Reviewed results she brought--CMP, CBC, Lipids, Vit D.  (These will be scanned into Epic.)  Vit D was 105.  This is in toxicity range.  Reports not addressed.  She is taking supplement.  Reviewed supplements and advised to stop.  Will need to have level rechecked.  Pt desires to return here for that follow up.  Having some issues with trochanteric bursitis.  Going to physical therapy.  She is going to Dr. Ninfa Linden.    No LMP recorded. Patient is postmenopausal.          Sexually active: No.  H/O STD:  +HR HPV  Health Maintenance: PCP:  Dr. Inda Merlin.  Last wellness appt was 01/2020.  Did blood work at that appt:  Yes and brought these with her. Vaccines are up to date:  yes Colonoscopy:  11/2017, follow up 5 years MMG:  10/21 BMD:  2016 Last pap smear:  2019.   H/o abnormal pap smear:  Yes, CIN 1 in 2012   reports that she quit smoking about 26 years ago. Her smoking use included cigarettes. She has never used smokeless tobacco. She reports current alcohol use of about 5.0 - 7.0 standard drinks of alcohol per week. She reports that she does not use drugs.  Past Medical History:  Diagnosis Date  . 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 Allergy PCN  . Arthritis   . H/O blood clots    18 months old - blood clots in right leg  . Hx of adenomatous polyp of rectum 02/19/2008  . Hypertension   . Infertility, female   . MVP (mitral valve prolapse)     Past Surgical History:  Procedure Laterality Date  . CHOLECYSTECTOMY N/A 09/06/2019    Procedure: LAPAROSCOPIC CHOLECYSTECTOMY, UMBILICIAL HERNIA REPAIR;  Surgeon: Coralie Keens, MD;  Location: [place];  Service: General;  Laterality: N/A;  . HYSTEROSCOPY    . TONSILECTOMY/ADENOIDECTOMY WITH MYRINGOTOMY      Current Outpatient Medications  Medication Sig Dispense Refill  . Boswellia Serrata (BOSWELLIA PO) Take 450 mg by mouth daily.     . cetirizine (ZYRTEC) 10 MG tablet Take 10 mg by mouth daily. Aller-Tec    . Cholecalciferol (VITAMIN D-3) 125 MCG (5000 UT) TABS Take 5,000 mg by mouth daily.    . Cyanocobalamin (VITAMIN B-12 PO) Place 1 drop under the tongue daily. Vitamin B-12 Liquid    . diclofenac sodium (VOLTAREN) 1 % GEL Apply 1 g topically 4 (four) times daily as needed (knee pain.).     Marland Kitchen hydrocortisone 2.5 % cream APPLY TO AFFECTED AREA TWICE A DAY    . ibuprofen (ADVIL) 200 MG tablet Take 400 mg by mouth every 8 (eight) hours as needed (pain.).    Marland Kitchen ketoconazole (NIZORAL) 2 % cream APPLY TO AFFECTED AREA TWICE A DAY    . Multiple Vitamins-Minerals (AIRBORNE PO) Take 1 tablet by mouth 2 (two) times a week.     Marland Kitchen omeprazole (PRILOSEC) 20  MG capsule 1 capsule 30 minutes before morning meal    . Polyethyl Glycol-Propyl Glycol (SYSTANE ULTRA) 0.4-0.3 % SOLN See admin instructions.    . valsartan-hydrochlorothiazide (DIOVAN-HCT) 80-12.5 MG tablet Take 0.5 tablets by mouth daily.   2  . vitamin k 100 MCG tablet 1 tablet     No current facility-administered medications for this visit.    Family History  Problem Relation Age of Onset  . Diabetes Mother   . Hypertension Mother   . Diabetes Father   . Hypertension Father   . Cancer Paternal Aunt        mouth cancer  . Cancer Paternal Uncle        mouth cancer  . Cancer Maternal Grandmother        mouth cancer  . Heart disease Paternal Grandfather   . Bladder Cancer Paternal Aunt 31          . Colon cancer Cousin 49  . Colon polyps Neg Hx   . Esophageal cancer Neg Hx   . Stomach cancer Neg Hx   . Rectal  cancer Neg Hx   . Pancreatic cancer Neg Hx   . Prostate cancer Neg Hx     Review of Systems  All other systems reviewed and are negative.   Exam:   BP 140/86   Pulse 75   Resp 18   Ht 5' 2.5" (1.588 m)   Wt 189 lb (85.7 kg)   BMI 34.02 kg/m   Height: 5' 2.5" (158.8 cm)  General appearance: alert, cooperative and appears stated age Breasts: normal appearance, no masses or tenderness Abdomen: soft, non-tender; bowel sounds normal; no masses,  no organomegaly Lymph nodes: Cervical, supraclavicular, and axillary nodes normal.  No abnormal inguinal nodes palpated Neurologic: Grossly normal  Pelvic: External genitalia:  no lesions              Urethra:  normal appearing urethra with no masses, tenderness or lesions              Bartholins and Skenes: normal                 Vagina: normal appearing vagina with normal color and discharge, no lesions              Cervix: no lesions              Pap taken: Yes.   Bimanual Exam:  Uterus:  normal size, contour, position, consistency, mobility, non-tender              Adnexa: normal adnexa and no mass, fullness, tenderness               Rectovaginal: Confirms               Anus:  normal sphincter tone, no lesions  Chaperone, Shela Nevin, RN, was present for exam.  Assessment/Plan: 1. GYN exam without abnormal finding - pap and HR HPV obtained today - MMG 08/2020 - BMD 2016 - colonoscopy up to date - lab work done 01/2021 with Dr. Inda Merlin  2. History of cervical dysplasia - PR OBTAINING SCREEN PAP SMEAR - Cytology - PAP( [place])  3. Hypoestrogenism and history of bone fractures - DG DXA FRACTURE ASSESSMENT; Future  4. Vitamin D intoxication - VITAMIN D 25 Hydroxy (Vit-D Deficiency, Fractures); Future  5. BMI 34.0-34.9,adult - Ambulatory referral to [facility]  6. H/O blood clots

## 2021-02-04 LAB — CYTOLOGY - PAP: Diagnosis: NEGATIVE

## 2021-02-05 ENCOUNTER — Encounter (INDEPENDENT_AMBULATORY_CARE_PROVIDER_SITE_OTHER): Payer: Self-pay

## 2021-02-06 ENCOUNTER — Other Ambulatory Visit (HOSPITAL_COMMUNITY): Payer: Self-pay | Admitting: Internal Medicine

## 2021-02-06 DIAGNOSIS — R0609 Other forms of dyspnea: Secondary | ICD-10-CM

## 2021-02-10 ENCOUNTER — Encounter: Payer: Self-pay | Admitting: Physical Therapy

## 2021-02-10 ENCOUNTER — Other Ambulatory Visit: Payer: Self-pay

## 2021-02-10 ENCOUNTER — Ambulatory Visit (INDEPENDENT_AMBULATORY_CARE_PROVIDER_SITE_OTHER): Payer: Medicare Other | Admitting: Physical Therapy

## 2021-02-10 DIAGNOSIS — M25561 Pain in right knee: Secondary | ICD-10-CM

## 2021-02-10 DIAGNOSIS — M6281 Muscle weakness (generalized): Secondary | ICD-10-CM | POA: Diagnosis not present

## 2021-02-10 DIAGNOSIS — M25551 Pain in right hip: Secondary | ICD-10-CM

## 2021-02-10 DIAGNOSIS — R262 Difficulty in walking, not elsewhere classified: Secondary | ICD-10-CM

## 2021-02-10 DIAGNOSIS — M25552 Pain in left hip: Secondary | ICD-10-CM | POA: Diagnosis not present

## 2021-02-10 NOTE — Therapy (Signed)
[facility] Physical Therapy [address] [place], [place], [address] Phone: [phone]   Fax:  [phone]  Physical Therapy Treatment  Patient Details  Name: Faith Galvan MRN: [id number] Date of Birth: 11-19-1955 Referring Provider (PT): Dr. Ninfa Linden   Encounter Date: 02/10/2021   PT End of Session - 02/10/21 1355    Visit Number 2    Number of Visits 20    Date for PT Re-Evaluation 04/06/21    Authorization Type Medicare 10 weeks POC, KX Modifier 15 visits    Authorization - Visit Number 2    Progress Note Due on Visit 10    PT Start Time 1300    PT Stop Time 1342    PT Time Calculation (min) 42 min    Activity Tolerance Patient tolerated treatment well    Behavior During Therapy [facility] for tasks assessed/performed           Past Medical History:  Diagnosis Date  . 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 Allergy PCN  . Arthritis   . H/O blood clots    18 months old - blood clots in right leg  . Hx of adenomatous polyp of rectum 02/19/2008  . Hypertension   . Infertility, female   . MVP (mitral valve prolapse)     Past Surgical History:  Procedure Laterality Date  . CHOLECYSTECTOMY N/A 09/06/2019   Procedure: LAPAROSCOPIC CHOLECYSTECTOMY, UMBILICIAL HERNIA REPAIR;  Surgeon: Coralie Keens, MD;  Location: [facility];  Service: General;  Laterality: N/A;  . HYSTEROSCOPY    . TONSILECTOMY/ADENOIDECTOMY WITH MYRINGOTOMY      There were no vitals filed for this visit.   Subjective Assessment - 02/10/21 1303    Subjective doing okay, pain still comes and goes.    Pertinent History Injection Lt hip.    Limitations Walking;Standing;Sitting    Patient Stated Goals Reduce pain, yard work, exercise    Currently in Pain? Yes    Pain Score 2     Pain Location Hip    Pain Orientation Right;Left    Pain Descriptors / Indicators Aching    Pain Type Chronic pain    Pain Onset More than a month ago   Dec 2021 Lt hip, Rt hip comes and goes    Pain Frequency Constant    Aggravating Factors  sitting, bike riding    Pain Relieving Factors injection at times, ice/heat    Pain Onset --   acute on chronic                            [facility] Adult PT Treatment/Exercise - 02/10/21 1304      Knee/Hip Exercises: Stretches   Hip Flexor Stretch Right;2 reps;30 seconds    Hip Flexor Stretch Limitations RLE off mat table    Piriformis Stretch Limitations increaed ant groin pain on Rt with this exercise - may need to modify next visit      Knee/Hip Exercises: Aerobic   Nustep L3 x 5 min      Knee/Hip Exercises: Seated   Other Seated Knee/Hip Exercises SLR x 10 bil    Sit to Sand 10 reps;without UE support      Knee/Hip Exercises: Supine   Bridges Both;10 reps    Other Supine Knee/Hip Exercises hooklying clamshells with L4 band x 10 bil (single limb)      Manual Therapy   Manual therapy comments Rt hip LAD and A/P  grade 2 mobs; use of tennis ball for self mobilization at home to piriformis and hip flexors, use of percussive device on Lt piriformis                  PT Education - 02/10/21 1355    Education Details modified HEP    Person(s) Educated Patient    Methods Explanation;Demonstration;Handout    Comprehension Verbalized understanding;Returned demonstration            PT Short Term Goals - 02/10/21 1356      PT SHORT TERM GOAL #1   Title Patient will demonstrate independent use of home exercise program to maintain progress from in clinic treatments.    Baseline 3/29: modified today    Time 3    Period Weeks    Status On-going    Target Date 02/16/21             PT Long Term Goals - 02/10/21 1356      PT LONG TERM GOAL #1   Title Patient will demonstrate/report pain at worst less than or equal to 2/10 to facilitate minimal limitation in daily activity secondary to pain symptoms.    Time 10    Period Weeks    Status On-going    Target Date 04/06/21      PT LONG TERM GOAL #2    Title Patient will demonstrate independent use of home exercise program to facilitate ability to maintain/progress functional gains from skilled physical therapy services.    Time 10    Period Weeks    Status On-going    Target Date 04/06/21      PT LONG TERM GOAL #3   Title Pt. will demonstrate bilateral LE MMT 5/5 throughout s symptoms to facilitate usual standing, walking at PLOF s limitation.    Time 10    Period Weeks    Status On-going    Target Date 04/06/21      PT LONG TERM GOAL #4   Title Pt. will demonstrate bilateral hip AROM WFL s symptoms to facilitate usual activity at PLOF.    Time 10    Period Weeks    Status On-going    Target Date 04/06/21      PT LONG TERM GOAL #5   Title Pt. will demonstrate FOTO > or = 59% to indicate reduced disability 2' to condition.    Time 10    Period Weeks    Status On-going    Target Date 04/06/21      PT LONG TERM GOAL #6   Title Pt. will demonstrate bialteral SLS 15 seconds s trendelenburg to facilitate improved stability in ambulation.    Time 10    Period Weeks    Status On-going    Target Date 04/06/21                 Plan - 02/10/21 1358    Clinical Impression Statement Pt tolerated session well today with review of HEP needing to modify clamshells to supine due to pain with pressue on medial knees.  Deferred DN today after discussion and trial of percussive device and tennis ball for STM.  Will continue to benefit from PT to maximize function.    Personal Factors and Comorbidities Comorbidity 1    Comorbidities HTN    Examination-Activity Limitations Sit;Squat;Stairs;Stand;Locomotion Level    Examination-Participation Restrictions Community Activity;Yard Work    Stability/Clinical Decision Making Stable/Uncomplicated    Rehab Potential Good    PT Frequency  2x / week    PT Duration Other (comment)   10 weeks   PT Treatment/Interventions ADLs/Self Care Home Management;Cryotherapy;Electrical  Stimulation;Iontophoresis 4mg /ml Dexamethasone;Moist Heat;Traction;Balance training;Therapeutic exercise;Therapeutic activities;Functional mobility training;Stair training;Gait training;Patient/family education;DME Instruction;Ultrasound;Neuromuscular re-education;Manual techniques;Taping;Passive range of motion;Dry needling;Spinal Manipulations;Joint Manipulations    PT Next Visit Plan Rt hip jt mobs for ROM, possible Lt hip DN, LE strengthening, see how tennis ball is going    PT Home Exercise Plan [phone]    Consulted and Agree with Plan of Care Patient           Patient will benefit from skilled therapeutic intervention in order to improve the following deficits and impairments:  Hypomobility,Pain,Increased fascial restricitons,Decreased strength,Decreased activity tolerance,Difficulty walking,Impaired perceived functional ability,Decreased range of motion,Decreased balance,Decreased mobility,Impaired flexibility,Decreased coordination  Visit Diagnosis: Pain in left hip  Pain in right hip  Muscle weakness (generalized)  Right knee pain, unspecified chronicity  Difficulty in walking, not elsewhere classified     Problem List Patient Active Problem List   Diagnosis Date Noted  . Hypertension 02/03/2021  . H/O blood clots   . S/P laparoscopic cholecystectomy 09/06/2019  . Overweight with body mass index (BMI) 25.0-29.9 03/15/2018  . Osteopenia 03/15/2018  . Intolerant of heat 03/15/2018  . Low back pain 03/15/2018  . Menopausal symptom 03/15/2018  . Urinary tract infectious disease 03/15/2018  . Sprain of ankle 06/24/2016  . Avulsion of right ankle 02/24/2016  . Toe pain, left 08/26/2015  . 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 Hallux limitus of left foot 08/26/2015  . Abnormal Pap smear of cervix 08/31/2012  . Abnormal Pap smear, atypical squamous cells of undetermined sign (ASC-US) 02/24/2012  . Tinea corporis 08/28/2009  . Disorder of bone and  cartilage 10/01/2008  . Allergic rhinitis 09/06/2008  . Benign neoplasm of colon 09/06/2008  . Personal history of colonic polyps 02/19/2008      Laureen Abrahams, PT, DPT 02/10/21 2:01 PM     [facility] Physical Therapy [address] [place], [place], [address] Phone: [phone]   Fax:  [phone]  Name: Faith Galvan MRN: [id number] Date of Birth: 29-May-1956

## 2021-02-10 NOTE — Patient Instructions (Signed)
Access Code: [email or web address] URL: [email or web address] Date: 02/10/2021 Prepared by: Faustino Congress  Exercises Supine Bridge - 2 x daily - 7 x weekly - 3 sets - 10 reps - 2 hold Supine Piriformis Stretch Pulling Heel to Hip - 2 x daily - 7 x weekly - 3 sets - 10 reps Seated Straight Leg Heel Taps - 2 x daily - 7 x weekly - 3 sets - 10 reps Sit to Stand - 1 x daily - 7 x weekly - 3 sets - 10 reps Hooklying Single Leg Bent Knee Fallouts with Resistance - 1 x daily - 7 x weekly - 3 sets - 10 reps Self Release - 2-3 x daily - 7 x weekly - 1 sets - 1-2 reps - 2-3 min hold

## 2021-02-11 ENCOUNTER — Ambulatory Visit (INDEPENDENT_AMBULATORY_CARE_PROVIDER_SITE_OTHER): Payer: Medicare Other | Admitting: Bariatrics

## 2021-02-11 ENCOUNTER — Encounter (INDEPENDENT_AMBULATORY_CARE_PROVIDER_SITE_OTHER): Payer: Self-pay | Admitting: Bariatrics

## 2021-02-11 VITALS — BP 145/87 | HR 72 | Temp 98.6°F | Ht 63.0 in | Wt 186.0 lb

## 2021-02-11 DIAGNOSIS — I10 Essential (primary) hypertension: Secondary | ICD-10-CM | POA: Diagnosis not present

## 2021-02-11 DIAGNOSIS — M545 Low back pain, unspecified: Secondary | ICD-10-CM

## 2021-02-11 DIAGNOSIS — R5383 Other fatigue: Secondary | ICD-10-CM

## 2021-02-11 DIAGNOSIS — G8929 Other chronic pain: Secondary | ICD-10-CM

## 2021-02-11 DIAGNOSIS — E6609 Other obesity due to excess calories: Secondary | ICD-10-CM

## 2021-02-11 DIAGNOSIS — Z1331 Encounter for screening for depression: Secondary | ICD-10-CM

## 2021-02-11 DIAGNOSIS — E669 Obesity, unspecified: Secondary | ICD-10-CM

## 2021-02-11 DIAGNOSIS — Z0289 Encounter for other administrative examinations: Secondary | ICD-10-CM

## 2021-02-11 DIAGNOSIS — Z6832 Body mass index (BMI) 32.0-32.9, adult: Secondary | ICD-10-CM | POA: Diagnosis not present

## 2021-02-11 DIAGNOSIS — R7309 Other abnormal glucose: Secondary | ICD-10-CM

## 2021-02-11 DIAGNOSIS — R0602 Shortness of breath: Secondary | ICD-10-CM | POA: Diagnosis not present

## 2021-02-11 DIAGNOSIS — E559 Vitamin D deficiency, unspecified: Secondary | ICD-10-CM

## 2021-02-11 DIAGNOSIS — E7800 Pure hypercholesterolemia, unspecified: Secondary | ICD-10-CM

## 2021-02-12 ENCOUNTER — Other Ambulatory Visit: Payer: Self-pay

## 2021-02-12 ENCOUNTER — Telehealth (HOSPITAL_COMMUNITY): Payer: Self-pay | Admitting: Internal Medicine

## 2021-02-12 ENCOUNTER — Encounter (INDEPENDENT_AMBULATORY_CARE_PROVIDER_SITE_OTHER): Payer: Self-pay | Admitting: Bariatrics

## 2021-02-12 ENCOUNTER — Ambulatory Visit (INDEPENDENT_AMBULATORY_CARE_PROVIDER_SITE_OTHER): Payer: Medicare Other | Admitting: Physical Therapy

## 2021-02-12 ENCOUNTER — Encounter: Payer: Self-pay | Admitting: Physical Therapy

## 2021-02-12 DIAGNOSIS — M25561 Pain in right knee: Secondary | ICD-10-CM

## 2021-02-12 DIAGNOSIS — M6281 Muscle weakness (generalized): Secondary | ICD-10-CM | POA: Diagnosis not present

## 2021-02-12 DIAGNOSIS — R7303 Prediabetes: Secondary | ICD-10-CM | POA: Insufficient documentation

## 2021-02-12 DIAGNOSIS — M25551 Pain in right hip: Secondary | ICD-10-CM | POA: Diagnosis not present

## 2021-02-12 DIAGNOSIS — M25552 Pain in left hip: Secondary | ICD-10-CM | POA: Diagnosis not present

## 2021-02-12 DIAGNOSIS — R262 Difficulty in walking, not elsewhere classified: Secondary | ICD-10-CM

## 2021-02-12 LAB — INSULIN, RANDOM: INSULIN: 12 u[IU]/mL (ref 2.6–24.9)

## 2021-02-12 LAB — HEMOGLOBIN A1C
Est. average glucose Bld gHb Est-mCnc: 120 mg/dL
Hgb A1c MFr Bld: 5.8 % — ABNORMAL HIGH (ref 4.8–5.6)

## 2021-02-12 NOTE — Telephone Encounter (Signed)
Patient did not want to schedule GXT ordered by MD due to she was transferring her PCP care to another Provider and asked that no information be sent to the Referring Provider. Order will be removed from the WQ. Thank you.

## 2021-02-12 NOTE — Therapy (Signed)
[facility] Physical Therapy [address] [place], [place], [address] Phone: [phone]   Fax:  [phone]  Physical Therapy Treatment  Patient Details  Name: Faith Galvan MRN: [id number] Date of Birth: [birth year] Referring Provider (PT): Dr. Ninfa Linden   Encounter Date: 02/12/2021   PT End of Session - 02/12/21 1429    Visit Number 3    Number of Visits 20    Date for PT Re-Evaluation 04/06/21    Authorization Type Medicare 10 weeks POC, KX Modifier 15 visits    Authorization - Visit Number 2    Progress Note Due on Visit 10    PT Start Time 6063    PT Stop Time 1425    PT Time Calculation (min) 40 min    Activity Tolerance Patient tolerated treatment well    Behavior During Therapy [facility] for tasks assessed/performed           Past Medical History:  Diagnosis Date  . 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 Allergy PCN  . Arthritis   . H/O blood clots    18 months old - blood clots in right leg  . Hx of adenomatous polyp of rectum 02/19/2008  . Hypertension   . Infertility, female   . MVP (mitral valve prolapse)     Past Surgical History:  Procedure Laterality Date  . CHOLECYSTECTOMY N/A 09/06/2019   Procedure: LAPAROSCOPIC CHOLECYSTECTOMY, UMBILICIAL HERNIA REPAIR;  Surgeon: Coralie Keens, MD;  Location: [place];  Service: General;  Laterality: N/A;  . HYSTEROSCOPY    . TONSILECTOMY/ADENOIDECTOMY WITH MYRINGOTOMY      There were no vitals filed for this visit.   Subjective Assessment - 02/12/21 1350    Subjective Lt hip really hurt yesterday.  Wasn't able to use cream due to early morning doctor's appt.    Pertinent History Injection Lt hip.    Limitations Walking;Standing;Sitting    Patient Stated Goals Reduce pain, yard work, exercise    Currently in Pain? Yes    Pain Score 2     Pain Location Hip    Pain Orientation Right    Pain Descriptors / Indicators Aching    Pain Type Chronic pain    Pain Onset More than a month  ago   Dec 2021 Lt hip, Rt hip comes and goes   Pain Frequency Constant    Aggravating Factors  sitting, bike riding    Pain Relieving Factors injection, ice/heat    Pain Onset --   acute on chronic                            [facility] Adult PT Treatment/Exercise - 02/12/21 1352      Knee/Hip Exercises: Stretches   Hip Flexor Stretch Right;2 reps;30 seconds    Hip Flexor Stretch Limitations seated    Piriformis Stretch Left;1 rep;20 seconds      Knee/Hip Exercises: Aerobic   Nustep L4 x 8 min      Knee/Hip Exercises: Standing   Hip Abduction 10 reps;Both    Hip Extension Both;10 reps      Knee/Hip Exercises: Supine   Other Supine Knee/Hip Exercises hooklying clamshells with L3 band x 10 bil (single limb)      Manual Therapy   Manual therapy comments compression to glutes and piriformis            Trigger Point Dry Needling - 02/12/21 1411    Consent  Given? Yes    Education Handout Provided Previously provided    Muscles Treated Back/Hip Piriformis;Gluteus maximus    Gluteus Maximus Response Twitch response elicited    Piriformis Response Twitch response elicited                  PT Short Term Goals - 02/12/21 1429      PT SHORT TERM GOAL #1   Title Patient will demonstrate independent use of home exercise program to maintain progress from in clinic treatments.    Baseline 3/31: inconsistent compliance to date    Time 3    Period Weeks    Status On-going    Target Date 02/16/21             PT Long Term Goals - 02/10/21 1356      PT LONG TERM GOAL #1   Title Patient will demonstrate/report pain at worst less than or equal to 2/10 to facilitate minimal limitation in daily activity secondary to pain symptoms.    Time 10    Period Weeks    Status On-going    Target Date 04/06/21      PT LONG TERM GOAL #2   Title Patient will demonstrate independent use of home exercise program to facilitate ability to maintain/progress functional gains  from skilled physical therapy services.    Time 10    Period Weeks    Status On-going    Target Date 04/06/21      PT LONG TERM GOAL #3   Title Pt. will demonstrate bilateral LE MMT 5/5 throughout s symptoms to facilitate usual standing, walking at PLOF s limitation.    Time 10    Period Weeks    Status On-going    Target Date 04/06/21      PT LONG TERM GOAL #4   Title Pt. will demonstrate bilateral hip AROM WFL s symptoms to facilitate usual activity at PLOF.    Time 10    Period Weeks    Status On-going    Target Date 04/06/21      PT LONG TERM GOAL #5   Title Pt. will demonstrate FOTO > or = 59% to indicate reduced disability 2' to condition.    Time 10    Period Weeks    Status On-going    Target Date 04/06/21      PT LONG TERM GOAL #6   Title Pt. will demonstrate bialteral SLS 15 seconds s trendelenburg to facilitate improved stability in ambulation.    Time 10    Period Weeks    Status On-going    Target Date 04/06/21                 Plan - 02/12/21 1430    Clinical Impression Statement Pt agreeable to trial of DN today with positive response immediately following reporting decreased symptoms.  Overall working on improving compliance with HEP and will continue to benefit from PT to maximize function.    Personal Factors and Comorbidities Comorbidity 1    Comorbidities HTN    Examination-Activity Limitations Sit;Squat;Stairs;Stand;Locomotion Level    Examination-Participation Restrictions Community Activity;Yard Work    Stability/Clinical Decision Making Stable/Uncomplicated    Rehab Potential Good    PT Frequency 2x / week    PT Duration Other (comment)   10 weeks   PT Treatment/Interventions ADLs/Self Care Home Management;Cryotherapy;Electrical Stimulation;Iontophoresis 4mg /ml Dexamethasone;Moist Heat;Traction;Balance training;Therapeutic exercise;Therapeutic activities;Functional mobility training;Stair training;Gait training;Patient/family education;DME  Instruction;Ultrasound;Neuromuscular re-education;Manual techniques;Taping;Passive range of motion;Dry needling;Spinal Manipulations;Joint Manipulations  PT Next Visit Plan Rt hip jt mobs for ROM, assess response to DN, LE strengthening, see how tennis ball is going    PT Home Exercise Plan [phone]    Consulted and Agree with Plan of Care Patient           Patient will benefit from skilled therapeutic intervention in order to improve the following deficits and impairments:  Hypomobility,Pain,Increased fascial restricitons,Decreased strength,Decreased activity tolerance,Difficulty walking,Impaired perceived functional ability,Decreased range of motion,Decreased balance,Decreased mobility,Impaired flexibility,Decreased coordination  Visit Diagnosis: Pain in left hip  Pain in right hip  Muscle weakness (generalized)  Right knee pain, unspecified chronicity  Difficulty in walking, not elsewhere classified     Problem List Patient Active Problem List   Diagnosis Date Noted  . Prediabetes 02/12/2021  . Hypertension 02/03/2021  . H/O blood clots   . S/P laparoscopic cholecystectomy 09/06/2019  . Overweight with body mass index (BMI) 25.0-29.9 03/15/2018  . Osteopenia 03/15/2018  . Intolerant of heat 03/15/2018  . Low back pain 03/15/2018  . Menopausal symptom 03/15/2018  . Urinary tract infectious disease 03/15/2018  . Sprain of ankle 06/24/2016  . Avulsion of right ankle 02/24/2016  . Toe pain, left 08/26/2015  . 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 Hallux limitus of left foot 08/26/2015  . Abnormal Pap smear of cervix 08/31/2012  . Abnormal Pap smear, atypical squamous cells of undetermined sign (ASC-US) 02/24/2012  . Tinea corporis 08/28/2009  . Disorder of bone and cartilage 10/01/2008  . Allergic rhinitis 09/06/2008  . Benign neoplasm of colon 09/06/2008  . Personal history of colonic polyps 02/19/2008      Laureen Abrahams, PT,  DPT 02/12/21 2:32 PM    [facility] Physical Therapy [address] [place], [place], [address] Phone: [phone]   Fax:  [phone]  Name: Gethsemane Fischler MRN: [id number] Date of Birth: 08-21-1956

## 2021-02-17 ENCOUNTER — Encounter (INDEPENDENT_AMBULATORY_CARE_PROVIDER_SITE_OTHER): Payer: Self-pay | Admitting: Bariatrics

## 2021-02-17 ENCOUNTER — Encounter: Payer: Self-pay | Admitting: Rehabilitative and Restorative Service Providers"

## 2021-02-17 ENCOUNTER — Ambulatory Visit (INDEPENDENT_AMBULATORY_CARE_PROVIDER_SITE_OTHER): Payer: Medicare Other | Admitting: Rehabilitative and Restorative Service Providers"

## 2021-02-17 ENCOUNTER — Other Ambulatory Visit: Payer: Self-pay

## 2021-02-17 DIAGNOSIS — M25651 Stiffness of right hip, not elsewhere classified: Secondary | ICD-10-CM | POA: Diagnosis not present

## 2021-02-17 DIAGNOSIS — M6281 Muscle weakness (generalized): Secondary | ICD-10-CM | POA: Diagnosis not present

## 2021-02-17 DIAGNOSIS — R262 Difficulty in walking, not elsewhere classified: Secondary | ICD-10-CM | POA: Diagnosis not present

## 2021-02-17 DIAGNOSIS — M25551 Pain in right hip: Secondary | ICD-10-CM

## 2021-02-17 NOTE — Progress Notes (Signed)
Dear Dr. Sabra Heck,   Thank you for referring Faith Galvan to our clinic. The following note includes my evaluation and treatment recommendations.  Chief Complaint:   OBESITY Faith Galvan (MR# [id number]) is a 65 y.o. female who presents for evaluation and treatment of obesity and related comorbidities. Current BMI is Body mass index is 32.95 kg/m. Faith Galvan has been struggling with her weight for many years and has been unsuccessful in either losing weight, maintaining weight loss, or reaching her healthy weight goal.  Faith Galvan is currently in the action stage of change and ready to dedicate time achieving and maintaining a healthier weight. Faith Galvan is interested in becoming our patient and working on intensive lifestyle modifications including (but not limited to) diet and exercise for weight loss.  Faith Galvan does like to cook but notes being tired and unsure of choices.  Faith Galvan's habits were reviewed today and are as follows: her desired weight loss is 41 lbs, she started gaining weight after she was put on blood pressure medication, her heaviest weight ever was 186 pounds, she has significant food cravings issues, she snacks frequently in the evenings, she skips meals frequently, she is frequently drinking liquids with calories, she frequently makes poor food choices, she has problems with excessive hunger and she struggles with emotional eating.  Depression Screen Faith Galvan (modified PHQ-9) score was 8.  Depression screen PHQ 2/9 02/11/2021  Decreased Interest 1  Down, Depressed, Hopeless 1  PHQ - 2 Score 2  Altered sleeping 1  Tired, decreased energy 2  Change in appetite 2  Feeling bad or failure about yourself  1  Trouble concentrating 0  Moving slowly or fidgety/restless 0  Suicidal thoughts 0  PHQ-9 Score 8  Difficult doing work/chores Somewhat difficult   Subjective:   1. Other fatigue Faith Galvan admits to daytime somnolence and admits to  waking up still tired. Patent has a history of symptoms of morning fatigue. Kassadi generally gets 3-6 hours of sleep per night, and states that she has poor sleep quality. Snoring  present. Apneic episodes are present. Epworth Sleepiness Score is 7.  2. SOB (shortness of breath) on exertion Ranetta notes increasing shortness of breath with exercising and seems to be worsening over time with weight gain. She notes getting out of breath sooner with activity than she used to. This has gotten worse recently. Joliet denies shortness of breath at rest or orthopnea.  3. Essential hypertension Sherra is taking Valsartan/HCTZ 80-12.5 mg.  4. Chronic low back pain without sciatica, unspecified back pain laterality Faith Galvan reports intermittent lower back pain prior to PT.  5. Vitamin D deficiency Faith Galvan was taking Vit D but stopped it. She states her Vit D level is high at 105.2.  6. Elevated cholesterol Faith Galvan's most recent HDL 84, LDL 167, and total 271. She is not on statin therapy.   7. Elevated glucose Faith Galvan has a history of elevated glucose.  Assessment/Plan:   1. Other fatigue Faith Galvan does feel that her weight is causing her energy to be lower than it should be. Fatigue may be related to obesity, depression or many other causes. Labs will be ordered, and in the meanwhile, Jisselle will focus on self care including making healthy food choices, increasing physical activity and focusing on stress reduction.  2. SOB (shortness of breath) on exertion Faith Galvan does feel that she gets out of breath more easily that she used to when she exercises. Faith Galvan's shortness of breath appears to be obesity  related and exercise induced. She has agreed to work on weight loss and gradually increase exercise to treat her exercise induced shortness of breath. Will continue to monitor closely.  3. Essential hypertension Faith Galvan is working on healthy weight loss and exercise to improve blood pressure control.  We will watch for signs of hypotension as she continues her lifestyle modifications. Continue current treatment plan.  4. Chronic low back pain without sciatica, unspecified back pain laterality No pounding exercises. Continue with physical therapy.  5. Vitamin D deficiency Low Vitamin D level contributes to fatigue and are associated with obesity, breast, and colon cancer. She agrees to discontinue Vitamin D supplementation and will follow-up for routine testing of Vitamin D, at least 2-3 times per year to avoid over-replacement.  6. Elevated cholesterol Cardiovascular risk and specific lipid/LDL goals reviewed.  We discussed several lifestyle modifications today and Smt. will continue to work on diet, exercise and weight loss efforts. Orders and follow up as documented in patient record.   Counseling Intensive lifestyle modifications are the first line treatment for this issue. . Dietary changes: Increase soluble fiber. Decrease simple carbohydrates. . Exercise changes: Moderate to vigorous-intensity aerobic activity 150 minutes per week if tolerated. . Lipid-lowering medications: see documented in medical record.  7. Elevated glucose Fasting labs will be obtained and results with be discussed with Faith Galvan in 2 weeks at her follow up visit. In the meanwhile Faith Galvan was started on a lower simple carbohydrate diet and will work on weight loss efforts.  - Insulin, random - Hemoglobin A1c  8. Depression screen Faith Galvan had a positive depression screening. Depression is commonly associated with obesity and often results in emotional eating behaviors. We will monitor this closely and work on CBT to help improve the non-hunger eating patterns. Referral to Psychology may be required if no improvement is seen as she continues in our clinic.  9. Class 1 obesity with serious comorbidity and body mass index (BMI) of 33.0 to 33.9 in adult, unspecified obesity type Faith Galvan is currently in the action  stage of change and her goal is to continue with weight loss efforts. I recommend Faith Galvan begin the structured treatment plan as follows:  She has agreed to the Category 1 Plan.  01/15/2021 labs reviewed with pt.  Exercise goals: No exercise has been prescribed at this time.   Behavioral modification strategies: increasing lean protein intake, decreasing simple carbohydrates, increasing vegetables, increasing water intake, decreasing eating out, no skipping meals, meal planning and cooking strategies, keeping healthy foods in the home and planning for success.  She was informed of the importance of frequent follow-up visits to maximize her success with intensive lifestyle modifications for her multiple health conditions. She was informed we would discuss her lab results at her next visit unless there is a critical issue that needs to be addressed sooner. Faith Galvan agreed to keep her next visit at the agreed upon time to discuss these results.  Objective:   Blood pressure (!) 145/87, pulse 72, temperature 98.6 F (37 C), height 5\' 3"  (1.6 m), weight 186 lb (84.4 kg), SpO2 96 %. Body mass index is 32.95 kg/m.  EKG: Normal sinus rhythm, rate (Unable to obtain).  Indirect Calorimeter completed today shows a VO2 of 144 and a REE of 1008.  Her calculated basal metabolic rate is 9767 thus her basal metabolic rate is worse than expected.  General: Cooperative, alert, well developed, in no acute distress. HEENT: Conjunctivae and lids unremarkable. Cardiovascular: Regular rhythm.  Lungs: Normal work of  breathing. Neurologic: No focal deficits.   Lab Results  Component Value Date   CREATININE 0.77 09/03/2019   BUN 18 09/03/2019   NA 138 09/03/2019   K 4.4 09/03/2019   CL 107 09/03/2019   CO2 21 (L) 09/03/2019   Lab Results  Component Value Date   ALT 25 09/22/2018   AST 15 09/22/2018   ALKPHOS 89 09/22/2018   BILITOT 0.3 09/22/2018   Lab Results  Component Value Date   HGBA1C 5.8 (H)  02/11/2021   Lab Results  Component Value Date   INSULIN 12.0 02/11/2021   Lab Results  Component Value Date   TSH 1.430 09/22/2018   Lab Results  Component Value Date   CHOL 247 (H) 09/22/2018   HDL 80 09/22/2018   LDLCALC 134 (H) 09/22/2018   TRIG 163 (H) 09/22/2018   CHOLHDL 3.1 09/22/2018   Lab Results  Component Value Date   WBC 6.1 09/03/2019   HGB 13.4 09/03/2019   HCT 40.4 09/03/2019   MCV 103.6 (H) 09/03/2019   PLT 258 09/03/2019   No results found for: IRON, TIBC, FERRITIN Obesity Behavioral Intervention:   Approximately 15 minutes were spent on the discussion below.  ASK: We discussed the diagnosis of obesity with Faith Galvan today and Shaniqua agreed to give [place] permission to discuss obesity behavioral modification therapy today.  ASSESS: Faith Galvan has the diagnosis of obesity and her BMI today is 33.1. Trenell is in the action stage of change.   ADVISE: Weslynn was educated on the multiple health risks of obesity as well as the benefit of weight loss to improve her health. She was advised of the need for long term treatment and the importance of lifestyle modifications to improve her current health and to decrease her risk of future health problems.  AGREE: Multiple dietary modification options and treatment options were discussed and Aylana agreed to follow the recommendations documented in the above note.  ARRANGE: Janesia was educated on the importance of frequent visits to treat obesity as outlined per CMS and USPSTF guidelines and agreed to schedule her next follow up appointment today.  Attestation Statements:   Reviewed by clinician on day of visit: allergies, medications, problem list, medical history, surgical history, family history, social history, and previous encounter notes.  Coral Ceo, am acting as Location manager for [employer], DO.  I have reviewed the above documentation for accuracy and completeness, and I agree with the above. Jearld Lesch, DO

## 2021-02-17 NOTE — Patient Instructions (Signed)
Access Code: [email or web address] URL: [email or web address] Date: 02/17/2021 Prepared by: Vista Mink  Exercises Supine Bridge - 2 x daily - 7 x weekly - 3 sets - 10 reps - 2 hold Supine Piriformis Stretch Pulling Heel to Hip - 2 x daily - 7 x weekly - 3 sets - 10 reps Seated Straight Leg Heel Taps - 2 x daily - 7 x weekly - 3 sets - 10 reps Sit to Stand - 1 x daily - 7 x weekly - 3 sets - 10 reps Hooklying Single Leg Bent Knee Fallouts with Resistance - 1 x daily - 7 x weekly - 3 sets - 10 reps Self Release - 2-3 x daily - 7 x weekly - 1 sets - 1-2 reps - 2-3 min hold Supine Gluteus Stretch - 2 x daily - 7 x weekly - 1 sets - 4-5 reps - 20 seconds hold Supine Figure 4 Piriformis Stretch - 2 x daily - 7 x weekly - 1 sets - 4-5 reps - 20 seconds hold Standing Hip Hiking - 2 x daily - 7 x weekly - 2 sets - 10 reps - 3 seconds hold

## 2021-02-17 NOTE — Therapy (Addendum)
[facility] [address] [place], [place], [address] Phone: [phone]   Fax:  [phone]  Physical Therapy Treatment  Patient Details  Name: Faith Galvan MRN: [id number] Date of Birth: May 16, 1956 Referring Provider (PT): Dr. Ninfa Linden   Encounter Date: 02/17/2021   PT End of Session - 02/17/21 1011    Visit Number 4    Number of Visits 20    Date for PT Re-Evaluation 04/06/21    Authorization Type Medicare 10 weeks POC, KX Modifier 15 visits    Authorization - Visit Number 4    Progress Note Due on Visit 10    PT Start Time 0930    PT Stop Time 5188    PT Time Calculation (min) 45 min    Activity Tolerance Patient tolerated treatment well;No increased pain    Behavior During Therapy WFL for tasks assessed/performed           Past Medical History:  Diagnosis Date  . 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 Allergy PCN  . Arthritis   . H/O blood clots    18 months old - blood clots in right leg  . Hx of adenomatous polyp of rectum 02/19/2008  . Hypertension   . Infertility, female   . MVP (mitral valve prolapse)     Past Surgical History:  Procedure Laterality Date  . CHOLECYSTECTOMY N/A 09/06/2019   Procedure: LAPAROSCOPIC CHOLECYSTECTOMY, UMBILICIAL HERNIA REPAIR;  Surgeon: Coralie Keens, MD;  Location: [place];  Service: General;  Laterality: N/A;  . HYSTEROSCOPY    . TONSILECTOMY/ADENOIDECTOMY WITH MYRINGOTOMY      There were no vitals filed for this visit.   Subjective Assessment - 02/17/21 1007    Subjective Emersen reports overall progress with her early physical therapy.  She is feeling the changes in the weather today.    Pertinent History Injection Lt hip.    Limitations Walking;Standing;Sitting    Patient Stated Goals Reduce pain, yard work, exercise    Currently in Pain? Yes    Pain Score 3     Pain Location Hip    Pain Orientation Right    Pain Descriptors / Indicators Aching;Sharp    Pain Type  Chronic pain    Pain Onset More than a month ago   Dec 2021 Lt hip, Rt hip comes and goes   Pain Frequency Constant    Aggravating Factors  Prolonged sitting, standing or walking    Pain Relieving Factors Stretches    Effect of Pain on Daily Activities Limits endurance with sitting and weight-bearing    Multiple Pain Sites No    Pain Onset --   acute on chronic                            [facility] Adult PT Treatment/Exercise - 02/17/21 0001      Exercises   Exercises Knee/Hip      Knee/Hip Exercises: Stretches   Hip Flexor Stretch Both;3 reps;20 seconds;Other (comment)    Hip Flexor Stretch Limitations Other leg straight supine    Piriformis Stretch Both;3 reps;20 seconds;Other (comment)    Piriformis Stretch Limitations Figure 4 stretch with same UE overpressure    Other Knee/Hip Stretches Gluteal stretch (knee to opposite shoulder and other leg straight) 3X 20 seconds      Knee/Hip Exercises: Standing   Other Standing Knee Exercises Alternating hip hike 10X 3 seconds (do not lean to side)  Knee/Hip Exercises: Seated   Sit to Sand 10 reps;without UE support;Other (comment)   slow eccentrics     Knee/Hip Exercises: Supine   Bridges Strengthening;Both;10 reps;Other (comment)    Bridges Limitations 5 second hold    Other Supine Knee/Hip Exercises hooklying clamshells with L3 band x 10 bil (single limb)      Manual Therapy   Manual therapy comments compression to glutes and piriformis            Trigger Point Dry Needling - 02/17/21 0001    Consent Given? Yes    Education Handout Provided Previously provided    Muscles Treated Back/Hip Piriformis;Gluteus maximus    Gluteus Maximus Response Twitch response elicited    Piriformis Response Twitch response elicited                PT Education - 02/17/21 1010    Education Details Reviewed HEP with progression to hip hike and addition of hip IR and ER stretch    Person(s) Educated Patient    Methods  Explanation;Demonstration;Tactile cues;Verbal cues;Handout    Comprehension Tactile cues required;Need further instruction;Verbal cues required;Returned demonstration;Verbalized understanding            PT Short Term Goals - 02/17/21 1010      PT SHORT TERM GOAL #1   Title Patient will demonstrate independent use of home exercise program to maintain progress from in clinic treatments.    Baseline 3/31: inconsistent compliance to date    Time 3    Period Weeks    Status On-going    Target Date 02/16/21             PT Long Term Goals - 02/17/21 1010      PT LONG TERM GOAL #1   Title Patient will demonstrate/report pain at worst less than or equal to 2/10 to facilitate minimal limitation in daily activity secondary to pain symptoms.    Time 10    Period Weeks    Status On-going      PT LONG TERM GOAL #2   Title Patient will demonstrate independent use of home exercise program to facilitate ability to maintain/progress functional gains from skilled physical therapy services.    Time 10    Period Weeks    Status On-going      PT LONG TERM GOAL #3   Title Pt. will demonstrate bilateral LE MMT 5/5 throughout s symptoms to facilitate usual standing, walking at PLOF s limitation.    Time 10    Period Weeks    Status On-going      PT LONG TERM GOAL #4   Title Pt. will demonstrate bilateral hip AROM WFL s symptoms to facilitate usual activity at PLOF.    Time 10    Period Weeks    Status On-going      PT LONG TERM GOAL #5   Title Pt. will demonstrate FOTO > or = 59% to indicate reduced disability 2' to condition.    Time 10    Period Weeks    Status On-going      PT LONG TERM GOAL #6   Title Pt. will demonstrate bialteral SLS 15 seconds s trendelenburg to facilitate improved stability in ambulation.    Time 10    Period Weeks    Status On-going                 Plan - 02/17/21 1011    Clinical Impression Statement Lenka notes progress with her hip  flexibility, although deficits persist.  Hip stretching was progressed today to address this.  Strength was also progressed and will benefit from continued work.    Personal Factors and Comorbidities Comorbidity 1    Comorbidities HTN    Examination-Activity Limitations Sit;Squat;Stairs;Stand;Locomotion Level    Examination-Participation Restrictions Community Activity;Yard Work    Stability/Clinical Decision Making Stable/Uncomplicated    Rehab Potential Good    PT Frequency 2x / week    PT Duration Other (comment)   10 weeks   PT Treatment/Interventions ADLs/Self Care Home Management;Cryotherapy;Electrical Stimulation;Iontophoresis 4mg /ml Dexamethasone;Moist Heat;Traction;Balance training;Therapeutic exercise;Therapeutic activities;Functional mobility training;Stair training;Gait training;Patient/family education;DME Instruction;Ultrasound;Neuromuscular re-education;Manual techniques;Taping;Passive range of motion;Dry needling;Spinal Manipulations;Joint Manipulations    PT Next Visit Plan Rt hip jt mobs for ROM, assess response to DN, LE strengthening, see how tennis ball is going    PT Home Exercise Plan [phone]    Consulted and Agree with Plan of Care Patient           Patient will benefit from skilled therapeutic intervention in order to improve the following deficits and impairments:  Hypomobility,Pain,Increased fascial restricitons,Decreased strength,Decreased activity tolerance,Difficulty walking,Impaired perceived functional ability,Decreased range of motion,Decreased balance,Decreased mobility,Impaired flexibility,Decreased coordination  Visit Diagnosis: Difficulty in walking, not elsewhere classified  Muscle weakness (generalized)  Pain in right hip  Stiffness of right hip, not elsewhere classified     Problem List Patient Active Problem List   Diagnosis Date Noted  . Prediabetes 02/12/2021  . Hypertension 02/03/2021  . H/O blood clots   . S/P laparoscopic  cholecystectomy 09/06/2019  . Overweight with body mass index (BMI) 25.0-29.9 03/15/2018  . Osteopenia 03/15/2018  . Intolerant of heat 03/15/2018  . Low back pain 03/15/2018  . Menopausal symptom 03/15/2018  . Urinary tract infectious disease 03/15/2018  . Sprain of ankle 06/24/2016  . Avulsion of right ankle 02/24/2016  . Toe pain, left 08/26/2015  . 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 Hallux limitus of left foot 08/26/2015  . Abnormal Pap smear of cervix 08/31/2012  . Abnormal Pap smear, atypical squamous cells of undetermined sign (ASC-US) 02/24/2012  . Tinea corporis 08/28/2009  . Disorder of bone and cartilage 10/01/2008  . Allergic rhinitis 09/06/2008  . Benign neoplasm of colon 09/06/2008  . Personal history of colonic polyps 02/19/2008    Farley Ly PT, MPT 02/17/2021, 12:32 PM  Laureen Abrahams, PT, DPT 02/17/21 12:44 PM   [facility] [address] [place], [place], [address] Phone: [phone]   Fax:  [phone]  Name: Kawehi Hostetter MRN: [id number] Date of Birth: [birth year]

## 2021-02-19 ENCOUNTER — Encounter: Payer: Self-pay | Admitting: Rehabilitative and Restorative Service Providers"

## 2021-02-19 ENCOUNTER — Other Ambulatory Visit: Payer: Self-pay

## 2021-02-19 ENCOUNTER — Ambulatory Visit (INDEPENDENT_AMBULATORY_CARE_PROVIDER_SITE_OTHER): Payer: Medicare Other | Admitting: Rehabilitative and Restorative Service Providers"

## 2021-02-19 DIAGNOSIS — M6281 Muscle weakness (generalized): Secondary | ICD-10-CM | POA: Diagnosis not present

## 2021-02-19 DIAGNOSIS — M25552 Pain in left hip: Secondary | ICD-10-CM | POA: Diagnosis not present

## 2021-02-19 DIAGNOSIS — R262 Difficulty in walking, not elsewhere classified: Secondary | ICD-10-CM

## 2021-02-19 DIAGNOSIS — M25551 Pain in right hip: Secondary | ICD-10-CM

## 2021-02-19 DIAGNOSIS — M25561 Pain in right knee: Secondary | ICD-10-CM | POA: Diagnosis not present

## 2021-02-19 NOTE — Therapy (Signed)
[facility] [address] [place], [place], [address] Phone: [phone]   Fax:  [phone]  Physical Therapy Treatment  Patient Details  Name: Faith Galvan MRN: [id number] Date of Birth: March 03, 1956 Referring Provider (PT): Dr. Ninfa Linden   Encounter Date: 02/19/2021   PT End of Session - 02/19/21 1140    Visit Number 5    Number of Visits 20    Date for PT Re-Evaluation 04/06/21    Authorization Type Medicare 10 weeks POC, KX Modifier 15 visits    Authorization - Visit Number 5    Progress Note Due on Visit 10    PT Start Time 1140    PT Stop Time 1220    PT Time Calculation (min) 40 min    Activity Tolerance Patient tolerated treatment well    Behavior During Therapy [facility] for tasks assessed/performed           Past Medical History:  Diagnosis Date  . 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 Allergy PCN  . Arthritis   . H/O blood clots    18 months old - blood clots in right leg  . Hx of adenomatous polyp of rectum 02/19/2008  . Hypertension   . Infertility, female   . MVP (mitral valve prolapse)     Past Surgical History:  Procedure Laterality Date  . CHOLECYSTECTOMY N/A 09/06/2019   Procedure: LAPAROSCOPIC CHOLECYSTECTOMY, UMBILICIAL HERNIA REPAIR;  Surgeon: Coralie Keens, MD;  Location: [place];  Service: General;  Laterality: N/A;  . HYSTEROSCOPY    . TONSILECTOMY/ADENOIDECTOMY WITH MYRINGOTOMY      There were no vitals filed for this visit.   Subjective Assessment - 02/19/21 1142    Subjective Pt. stated feeling achy more than pain.  Pt. stated achy noted throughout body.  Pt. stated Lt posteiror buttock/hip feeling pretty good today.  Pt. did not hurting last evening in most areas after doing mowing, exercise at home and other daily activity.    Pertinent History Injection Lt hip.    Limitations Walking;Standing;Sitting    Patient Stated Goals Reduce pain, yard work, exercise    Currently in Pain? Yes     Pain Score 5    at worst,not constant for Lt hip   Pain Location Hip    Pain Orientation Left    Pain Descriptors / Indicators Aching    Pain Type Chronic pain    Pain Onset More than a month ago   Dec 2021 Lt hip, Rt hip comes and goes   Pain Frequency Intermittent    Aggravating Factors  sitting prolonged (30 mins)    Pain Relieving Factors HEP, stretching, needling    Pain Location Knee    Pain Orientation Right    Pain Type Chronic pain    Pain Onset More than a month ago   acute on chronic   Pain Frequency Intermittent    Aggravating Factors  lying on side    Pain Relieving Factors HEP              [facility] PT Assessment - 02/19/21 0001      Assessment   Medical Diagnosis Bilateral hip pain, Rt knee pain    Referring Provider (PT) Dr. Ninfa Linden    Onset Date/Surgical Date 10/15/20    Hand Dominance Right      Single Leg Stance   Comments SLS 10 seconds bilateral c reduction of trendelenberg (still noted mildly)      Sit to  Stand   Comments 18 inch s UE, mild knee pain (reported better than 1st day)                         [facility] Adult PT Treatment/Exercise - 02/19/21 0001      Knee/Hip Exercises: Stretches   Other Knee/Hip Stretches supine figure 4 ER overpressure stretch 20 sec x 3 and flexion overpressure stretch 20 sec x 3      Knee/Hip Exercises: Standing   Lateral Step Up Step Height: 4";Both   2 x 10 bilateral c eccentric focus   Other Standing Knee Exercises progression of hip hike to SL doorway hp abd push c hip hike 5 sec x 10 bilateral      Knee/Hip Exercises: Supine   Bridges Both;Strengthening   5 sec hold x 15     Manual Therapy   Manual therapy comments percussive device to Lt glute med/max.  g3 inferior Rt hip jt mobs, lateral jt mobs, MWM inferior c FABER                    PT Short Term Goals - 02/19/21 1216      PT SHORT TERM GOAL #1   Title Patient will demonstrate independent use of home exercise program to maintain  progress from in clinic treatments.    Baseline 3/31: inconsistent compliance to date    Time 3    Period Weeks    Status Achieved    Target Date 02/16/21             PT Long Term Goals - 02/17/21 1010      PT LONG TERM GOAL #1   Title Patient will demonstrate/report pain at worst less than or equal to 2/10 to facilitate minimal limitation in daily activity secondary to pain symptoms.    Time 10    Period Weeks    Status On-going      PT LONG TERM GOAL #2   Title Patient will demonstrate independent use of home exercise program to facilitate ability to maintain/progress functional gains from skilled physical therapy services.    Time 10    Period Weeks    Status On-going      PT LONG TERM GOAL #3   Title Pt. will demonstrate bilateral LE MMT 5/5 throughout s symptoms to facilitate usual standing, walking at PLOF s limitation.    Time 10    Period Weeks    Status On-going      PT LONG TERM GOAL #4   Title Pt. will demonstrate bilateral hip AROM WFL s symptoms to facilitate usual activity at PLOF.    Time 10    Period Weeks    Status On-going      PT LONG TERM GOAL #5   Title Pt. will demonstrate FOTO > or = 59% to indicate reduced disability 2' to condition.    Time 10    Period Weeks    Status On-going      PT LONG TERM GOAL #6   Title Pt. will demonstrate bialteral SLS 15 seconds s trendelenburg to facilitate improved stability in ambulation.    Time 10    Period Weeks    Status On-going                 Plan - 02/19/21 1159    Clinical Impression Statement Tenderness still noted in Lt lateral/posterior hip musculature but improving.  Rt hip restrictions seems to  be more based off hip jt mobility deficits as noted in FABER testing and isolated hip jt mobility assessment.  Great improvement in symptoms and Rt hip IR/ER noted from hip jt mobility intervention in manual.    Personal Factors and Comorbidities Comorbidity 1    Comorbidities HTN     Examination-Activity Limitations Sit;Squat;Stairs;Stand;Locomotion Level    Examination-Participation Restrictions Community Activity;Yard Work    Stability/Clinical Decision Making Stable/Uncomplicated    Rehab Potential Good    PT Frequency 2x / week    PT Duration Other (comment)   10 weeks   PT Treatment/Interventions ADLs/Self Care Home Management;Cryotherapy;Electrical Stimulation;Iontophoresis 4mg /ml Dexamethasone;Moist Heat;Traction;Balance training;Therapeutic exercise;Therapeutic activities;Functional mobility training;Stair training;Gait training;Patient/family education;DME Instruction;Ultrasound;Neuromuscular re-education;Manual techniques;Taping;Passive range of motion;Dry needling;Spinal Manipulations;Joint Manipulations    PT Next Visit Plan Strongly recommend Rt hip inferior, lateral, FABER MWM to Rt hp jt mobility barring adverse reaction (great benefit in today's viist) DN as desired.    PT Home Exercise Plan [phone]    Consulted and Agree with Plan of Care Patient           Patient will benefit from skilled therapeutic intervention in order to improve the following deficits and impairments:  Hypomobility,Pain,Increased fascial restricitons,Decreased strength,Decreased activity tolerance,Difficulty walking,Impaired perceived functional ability,Decreased range of motion,Decreased balance,Decreased mobility,Impaired flexibility,Decreased coordination  Visit Diagnosis: Pain in left hip  Pain in right hip  Muscle weakness (generalized)  Right knee pain, unspecified chronicity  Difficulty in walking, not elsewhere classified     Problem List Patient Active Problem List   Diagnosis Date Noted  . Prediabetes 02/12/2021  . Hypertension 02/03/2021  . H/O blood clots   . S/P laparoscopic cholecystectomy 09/06/2019  . Overweight with body mass index (BMI) 25.0-29.9 03/15/2018  . Osteopenia 03/15/2018  . Intolerant of heat 03/15/2018  . Low back pain 03/15/2018  .  Menopausal symptom 03/15/2018  . Urinary tract infectious disease 03/15/2018  . Sprain of ankle 06/24/2016  . Avulsion of right ankle 02/24/2016  . Toe pain, left 08/26/2015  . 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 Hallux limitus of left foot 08/26/2015  . Abnormal Pap smear of cervix 08/31/2012  . Abnormal Pap smear, atypical squamous cells of undetermined sign (ASC-US) 02/24/2012  . Tinea corporis 08/28/2009  . Disorder of bone and cartilage 10/01/2008  . Allergic rhinitis 09/06/2008  . Benign neoplasm of colon 09/06/2008  . Personal history of colonic polyps 02/19/2008    Scot Jun, PT, DPT, OCS, ATC 02/19/21  12:24 PM    [facility] [address] [place], [place], [address] Phone: [phone]   Fax:  [phone]  Name: Faith Galvan MRN: [id number] Date of Birth: Apr 09, 1956

## 2021-02-24 ENCOUNTER — Encounter: Payer: Self-pay | Admitting: Rehabilitative and Restorative Service Providers"

## 2021-02-24 ENCOUNTER — Ambulatory Visit (INDEPENDENT_AMBULATORY_CARE_PROVIDER_SITE_OTHER): Payer: Medicare Other | Admitting: Orthopaedic Surgery

## 2021-02-24 ENCOUNTER — Encounter: Payer: Self-pay | Admitting: Orthopaedic Surgery

## 2021-02-24 ENCOUNTER — Ambulatory Visit (INDEPENDENT_AMBULATORY_CARE_PROVIDER_SITE_OTHER): Payer: Medicare Other | Admitting: Rehabilitative and Restorative Service Providers"

## 2021-02-24 ENCOUNTER — Other Ambulatory Visit: Payer: Self-pay

## 2021-02-24 DIAGNOSIS — M25551 Pain in right hip: Secondary | ICD-10-CM

## 2021-02-24 DIAGNOSIS — M25561 Pain in right knee: Secondary | ICD-10-CM

## 2021-02-24 DIAGNOSIS — M6281 Muscle weakness (generalized): Secondary | ICD-10-CM

## 2021-02-24 DIAGNOSIS — M25552 Pain in left hip: Secondary | ICD-10-CM | POA: Diagnosis not present

## 2021-02-24 DIAGNOSIS — M7062 Trochanteric bursitis, left hip: Secondary | ICD-10-CM | POA: Diagnosis not present

## 2021-02-24 DIAGNOSIS — R262 Difficulty in walking, not elsewhere classified: Secondary | ICD-10-CM

## 2021-02-24 DIAGNOSIS — M25651 Stiffness of right hip, not elsewhere classified: Secondary | ICD-10-CM

## 2021-02-24 NOTE — Progress Notes (Signed)
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  When I saw her last she had acute flareup of pain with her right knee.  That is subsiding with therapy.  Physical therapy is helping her she states.  The pain does not cause any more.  I have injected the trochanteric area about 3 times.  2 injections helped greatly but the next when she said and really help 1 bit.  She says that the right knee is done much better.  Examination of her left hip shows it moves smoothly and fluidly as does her right knee.  She does have pain to palpation of the trochanteric area.  There is negative straight leg raise on the left side as well.  She will continue physical therapy and I like to see her back in 4 weeks to consider a repeat steroid injection of the trochanteric area hopefully find the place where she is most point tender so we can get this in the right area on the left hip.  All questions and concerns were answered and addressed.

## 2021-02-24 NOTE — Therapy (Signed)
[facility] [address] [place], [place], [address] Phone: [phone]   Fax:  [phone]  Physical Therapy Treatment  Patient Details  Name: Faith Galvan MRN: [id number] Date of Birth: [birth year] Referring Provider (PT): Dr. Ninfa Linden   Encounter Date: 02/24/2021   PT End of Session - 02/24/21 1102    Visit Number 6    Number of Visits 20    Date for PT Re-Evaluation 04/06/21    Authorization Type Medicare 10 weeks POC, KX Modifier 15 visits    Authorization - Visit Number 6    Progress Note Due on Visit 10    PT Start Time 1100    PT Stop Time 1140    PT Time Calculation (min) 40 min    Activity Tolerance Patient tolerated treatment well    Behavior During Therapy [facility] for tasks assessed/performed           Past Medical History:  Diagnosis Date  . 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 Allergy PCN  . Arthritis   . H/O blood clots    18 months old - blood clots in right leg  . Hx of adenomatous polyp of rectum 02/19/2008  . Hypertension   . Infertility, female   . MVP (mitral valve prolapse)     Past Surgical History:  Procedure Laterality Date  . CHOLECYSTECTOMY N/A 09/06/2019   Procedure: LAPAROSCOPIC CHOLECYSTECTOMY, UMBILICIAL HERNIA REPAIR;  Surgeon: Coralie Keens, MD;  Location: [place];  Service: General;  Laterality: N/A;  . HYSTEROSCOPY    . TONSILECTOMY/ADENOIDECTOMY WITH MYRINGOTOMY      There were no vitals filed for this visit.   Subjective Assessment - 02/24/21 1106    Subjective Pt. indicated Rt hip seemed to be doing better and knee occasional feeling something but overall better still.  Pt. stated Lt hip/buttock was painful after MD visit testing.   Rated Lt hip area today at 5/10.    Pertinent History Injection Lt hip.    Limitations Walking;Standing;Sitting    Patient Stated Goals Reduce pain, yard work, exercise    Pain Score 5     Pain Location Hip    Pain Orientation Left    Pain  Descriptors / Indicators Aching;Sore    Pain Onset More than a month ago   Dec 2021 Lt hip, Rt hip comes and goes   Aggravating Factors  MD visit testing, bed movements.    Pain Onset More than a month ago   acute on chronic                            [facility] Adult PT Treatment/Exercise - 02/24/21 0001      Knee/Hip Exercises: Stretches   Other Knee/Hip Stretches supine figure 4 ER overpressure stretch 30 sec x 3 and flexion overpressure stretch 20 sec x 3      Knee/Hip Exercises: Aerobic   Nustep Lvl 6 10 mins      Knee/Hip Exercises: Standing   Other Standing Knee Exercises progression of hip hike to SL doorway hip abd push c hip hike 5 sec x 10 bilateral      Knee/Hip Exercises: Sidelying   Clams 3 x 15 bilateral      Manual Therapy   Manual therapy comments compression to Lt glute med, g3 inferior, lateral Rt hip joint mobilizations            Trigger Point Dry Needling -  02/24/21 0001    Consent Given? Yes    Education Handout Provided Previously provided    Muscles Treated Back/Hip Gluteus medius   Lt   Gluteus Medius Response Twitch response elicited                  PT Short Term Goals - 02/19/21 1216      PT SHORT TERM GOAL #1   Title Patient will demonstrate independent use of home exercise program to maintain progress from in clinic treatments.    Baseline 3/31: inconsistent compliance to date    Time 3    Period Weeks    Status Achieved    Target Date 02/16/21             PT Long Term Goals - 02/17/21 1010      PT LONG TERM GOAL #1   Title Patient will demonstrate/report pain at worst less than or equal to 2/10 to facilitate minimal limitation in daily activity secondary to pain symptoms.    Time 10    Period Weeks    Status On-going      PT LONG TERM GOAL #2   Title Patient will demonstrate independent use of home exercise program to facilitate ability to maintain/progress functional gains from skilled physical therapy  services.    Time 10    Period Weeks    Status On-going      PT LONG TERM GOAL #3   Title Pt. will demonstrate bilateral LE MMT 5/5 throughout s symptoms to facilitate usual standing, walking at PLOF s limitation.    Time 10    Period Weeks    Status On-going      PT LONG TERM GOAL #4   Title Pt. will demonstrate bilateral hip AROM WFL s symptoms to facilitate usual activity at PLOF.    Time 10    Period Weeks    Status On-going      PT LONG TERM GOAL #5   Title Pt. will demonstrate FOTO > or = 59% to indicate reduced disability 2' to condition.    Time 10    Period Weeks    Status On-going      PT LONG TERM GOAL #6   Title Pt. will demonstrate bialteral SLS 15 seconds s trendelenburg to facilitate improved stability in ambulation.    Time 10    Period Weeks    Status On-going                 Plan - 02/24/21 1118    Clinical Impression Statement Returned to use of compression, dry needling to address complaints in Lt lateral/posterior hip of myofascial origin.  Good tolerance.  Continued to provide intervention to improve hip strength and control and overall mobility gains c positive response.    Personal Factors and Comorbidities Comorbidity 1    Comorbidities HTN    Examination-Activity Limitations Sit;Squat;Stairs;Stand;Locomotion Level    Examination-Participation Restrictions Community Activity;Yard Work    Stability/Clinical Decision Making Stable/Uncomplicated    Rehab Potential Good    PT Frequency 2x / week    PT Duration Other (comment)   10 weeks   PT Treatment/Interventions ADLs/Self Care Home Management;Cryotherapy;Electrical Stimulation;Iontophoresis 4mg /ml Dexamethasone;Moist Heat;Traction;Balance training;Therapeutic exercise;Therapeutic activities;Functional mobility training;Stair training;Gait training;Patient/family education;DME Instruction;Ultrasound;Neuromuscular re-education;Manual techniques;Taping;Passive range of motion;Dry needling;Spinal  Manipulations;Joint Manipulations    PT Next Visit Plan Continued recommend Rt hip inferior, lateral, FABER MWM to Rt hp jt mobility barring adverse reaction c DN as desired.    PT  Home Exercise Plan [phone]    Consulted and Agree with Plan of Care Patient           Patient will benefit from skilled therapeutic intervention in order to improve the following deficits and impairments:  Hypomobility,Pain,Increased fascial restricitons,Decreased strength,Decreased activity tolerance,Difficulty walking,Impaired perceived functional ability,Decreased range of motion,Decreased balance,Decreased mobility,Impaired flexibility,Decreased coordination  Visit Diagnosis: Pain in left hip  Pain in right hip  Muscle weakness (generalized)  Right knee pain, unspecified chronicity  Difficulty in walking, not elsewhere classified  Stiffness of right hip, not elsewhere classified     Problem List Patient Active Problem List   Diagnosis Date Noted  . Prediabetes 02/12/2021  . Hypertension 02/03/2021  . H/O blood clots   . S/P laparoscopic cholecystectomy 09/06/2019  . Overweight with body mass index (BMI) 25.0-29.9 03/15/2018  . Osteopenia 03/15/2018  . Intolerant of heat 03/15/2018  . Low back pain 03/15/2018  . Menopausal symptom 03/15/2018  . Urinary tract infectious disease 03/15/2018  . Sprain of ankle 06/24/2016  . Avulsion of right ankle 02/24/2016  . Toe pain, left 08/26/2015  . 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 Hallux limitus of left foot 08/26/2015  . Abnormal Pap smear of cervix 08/31/2012  . Abnormal Pap smear, atypical squamous cells of undetermined sign (ASC-US) 02/24/2012  . Tinea corporis 08/28/2009  . Disorder of bone and cartilage 10/01/2008  . Allergic rhinitis 09/06/2008  . Benign neoplasm of colon 09/06/2008  . Personal history of colonic polyps 02/19/2008   Scot Jun, PT, DPT, OCS, ATC 02/24/21  11:34 AM    [facility] [address] [place], [place], [address] Phone: [phone]   Fax:  [phone]  Name: Joliana Claflin MRN: [id number] Date of Birth: 06/11/1956

## 2021-02-25 ENCOUNTER — Encounter (INDEPENDENT_AMBULATORY_CARE_PROVIDER_SITE_OTHER): Payer: Self-pay | Admitting: Bariatrics

## 2021-02-25 ENCOUNTER — Ambulatory Visit (INDEPENDENT_AMBULATORY_CARE_PROVIDER_SITE_OTHER): Payer: Medicare Other | Admitting: Bariatrics

## 2021-02-25 VITALS — BP 142/86 | HR 78 | Temp 98.2°F | Ht 63.0 in | Wt 186.0 lb

## 2021-02-25 DIAGNOSIS — R7303 Prediabetes: Secondary | ICD-10-CM

## 2021-02-25 DIAGNOSIS — I10 Essential (primary) hypertension: Secondary | ICD-10-CM

## 2021-02-25 DIAGNOSIS — Z6832 Body mass index (BMI) 32.0-32.9, adult: Secondary | ICD-10-CM | POA: Diagnosis not present

## 2021-02-25 DIAGNOSIS — E669 Obesity, unspecified: Secondary | ICD-10-CM | POA: Diagnosis not present

## 2021-02-26 ENCOUNTER — Encounter: Payer: Self-pay | Admitting: Rehabilitative and Restorative Service Providers"

## 2021-02-26 ENCOUNTER — Ambulatory Visit (INDEPENDENT_AMBULATORY_CARE_PROVIDER_SITE_OTHER): Payer: Medicare Other | Admitting: Rehabilitative and Restorative Service Providers"

## 2021-02-26 ENCOUNTER — Encounter (INDEPENDENT_AMBULATORY_CARE_PROVIDER_SITE_OTHER): Payer: Self-pay | Admitting: Bariatrics

## 2021-02-26 ENCOUNTER — Other Ambulatory Visit: Payer: Self-pay

## 2021-02-26 DIAGNOSIS — M25561 Pain in right knee: Secondary | ICD-10-CM

## 2021-02-26 DIAGNOSIS — R262 Difficulty in walking, not elsewhere classified: Secondary | ICD-10-CM

## 2021-02-26 DIAGNOSIS — M25551 Pain in right hip: Secondary | ICD-10-CM

## 2021-02-26 DIAGNOSIS — M6281 Muscle weakness (generalized): Secondary | ICD-10-CM

## 2021-02-26 DIAGNOSIS — M25552 Pain in left hip: Secondary | ICD-10-CM | POA: Diagnosis not present

## 2021-02-26 NOTE — Therapy (Signed)
[facility] [address] [place], [place], [address] Phone: [phone]   Fax:  [phone]  Physical Therapy Treatment  Patient Details  Name: Faith Galvan MRN: [id number] Date of Birth: December 17, 1955 Referring Provider (PT): Dr. Ninfa Linden   Encounter Date: 02/26/2021   PT End of Session - 02/26/21 0936    Visit Number 7    Number of Visits 20    Date for PT Re-Evaluation 04/06/21    Authorization Type Medicare 10 weeks POC, KX Modifier 15 visits    Authorization - Visit Number 7    Progress Note Due on Visit 10    PT Start Time 0930    PT Stop Time 1010    PT Time Calculation (min) 40 min    Activity Tolerance Patient tolerated treatment well    Behavior During Therapy [facility] for tasks assessed/performed           Past Medical History:  Diagnosis Date  . 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 Allergy PCN  . Arthritis   . H/O blood clots    18 months old - blood clots in right leg  . Hx of adenomatous polyp of rectum 02/19/2008  . Hypertension   . Infertility, female   . MVP (mitral valve prolapse)     Past Surgical History:  Procedure Laterality Date  . CHOLECYSTECTOMY N/A 09/06/2019   Procedure: LAPAROSCOPIC CHOLECYSTECTOMY, UMBILICIAL HERNIA REPAIR;  Surgeon: Coralie Keens, MD;  Location: [place];  Service: General;  Laterality: N/A;  . HYSTEROSCOPY    . TONSILECTOMY/ADENOIDECTOMY WITH MYRINGOTOMY      There were no vitals filed for this visit.   Subjective Assessment - 02/26/21 0935    Subjective Pt. reported feeling no real pain complaints in hips today upon arrival.  Pt. stated yesterday she felt like "every bone in her body was hurting" and not sure why.  Walking seemed to help some.    Pertinent History Injection Lt hip.    Limitations Walking;Standing;Sitting    Patient Stated Goals Reduce pain, yard work, exercise    Currently in Pain? No/denies    Pain Score 0-No pain    Pain Onset More than a month  ago   Dec 2021 Lt hip, Rt hip comes and goes   Pain Score 0    Pain Onset More than a month ago   acute on chronic                            [facility] Adult PT Treatment/Exercise - 02/26/21 0001      Knee/Hip Exercises: Stretches   Other Knee/Hip Stretches supine figure 4 ER overpressure stretch 30 sec x 3 and flexion overpressure stretch 30 sec x 3, performed bilateral      Knee/Hip Exercises: Aerobic   Nustep Lvl 6 12 mins      Knee/Hip Exercises: Machines for Strengthening   Total Gym Leg Press single leg 2 x 10 50 lbs each      Knee/Hip Exercises: Standing   Other Standing Knee Exercises lateral stepping green band 10 ft x 6 each way      Knee/Hip Exercises: Seated   Other Seated Knee/Hip Exercises SLR 2 x 10 bilateral      Knee/Hip Exercises: Supine   Bridges Both;10 reps   5 seconds                   PT Short  Term Goals - 02/19/21 1216      PT SHORT TERM GOAL #1   Title Patient will demonstrate independent use of home exercise program to maintain progress from in clinic treatments.    Baseline 3/31: inconsistent compliance to date    Time 3    Period Weeks    Status Achieved    Target Date 02/16/21             PT Long Term Goals - 02/17/21 1010      PT LONG TERM GOAL #1   Title Patient will demonstrate/report pain at worst less than or equal to 2/10 to facilitate minimal limitation in daily activity secondary to pain symptoms.    Time 10    Period Weeks    Status On-going      PT LONG TERM GOAL #2   Title Patient will demonstrate independent use of home exercise program to facilitate ability to maintain/progress functional gains from skilled physical therapy services.    Time 10    Period Weeks    Status On-going      PT LONG TERM GOAL #3   Title Pt. will demonstrate bilateral LE MMT 5/5 throughout s symptoms to facilitate usual standing, walking at PLOF s limitation.    Time 10    Period Weeks    Status On-going      PT  LONG TERM GOAL #4   Title Pt. will demonstrate bilateral hip AROM WFL s symptoms to facilitate usual activity at PLOF.    Time 10    Period Weeks    Status On-going      PT LONG TERM GOAL #5   Title Pt. will demonstrate FOTO > or = 59% to indicate reduced disability 2' to condition.    Time 10    Period Weeks    Status On-going      PT LONG TERM GOAL #6   Title Pt. will demonstrate bialteral SLS 15 seconds s trendelenburg to facilitate improved stability in ambulation.    Time 10    Period Weeks    Status On-going                 Plan - 02/26/21 1000    Clinical Impression Statement Indication of continued strength and endurance deficits related to lateral/posterior hip.  Due to symptom presentation today, manual intervention held and may be resumed based off future visit presentations.    Personal Factors and Comorbidities Comorbidity 1    Comorbidities HTN    Examination-Activity Limitations Sit;Squat;Stairs;Stand;Locomotion Level    Examination-Participation Restrictions Community Activity;Yard Work    Stability/Clinical Decision Making Stable/Uncomplicated    Rehab Potential Good    PT Frequency 2x / week    PT Duration Other (comment)   10 weeks   PT Treatment/Interventions ADLs/Self Care Home Management;Cryotherapy;Electrical Stimulation;Iontophoresis 4mg /ml Dexamethasone;Moist Heat;Traction;Balance training;Therapeutic exercise;Therapeutic activities;Functional mobility training;Stair training;Gait training;Patient/family education;DME Instruction;Ultrasound;Neuromuscular re-education;Manual techniques;Taping;Passive range of motion;Dry needling;Spinal Manipulations;Joint Manipulations    PT Next Visit Plan Rt hip inferior, lateral mobilizations, dry needling prn, continued progression of strengthening intervention for hip for improved strength and control in daily movement.    PT Home Exercise Plan [phone]    Consulted and Agree with Plan of Care Patient            Patient will benefit from skilled therapeutic intervention in order to improve the following deficits and impairments:  Hypomobility,Pain,Increased fascial restricitons,Decreased strength,Decreased activity tolerance,Difficulty walking,Impaired perceived functional ability,Decreased range of motion,Decreased balance,Decreased mobility,Impaired flexibility,Decreased coordination  Visit Diagnosis: Pain in left hip  Pain in right hip  Muscle weakness (generalized)  Right knee pain, unspecified chronicity  Difficulty in walking, not elsewhere classified     Problem List Patient Active Problem List   Diagnosis Date Noted  . Prediabetes 02/12/2021  . Hypertension 02/03/2021  . H/O blood clots   . S/P laparoscopic cholecystectomy 09/06/2019  . Overweight with body mass index (BMI) 25.0-29.9 03/15/2018  . Osteopenia 03/15/2018  . Intolerant of heat 03/15/2018  . Low back pain 03/15/2018  . Menopausal symptom 03/15/2018  . Urinary tract infectious disease 03/15/2018  . Sprain of ankle 06/24/2016  . Avulsion of right ankle 02/24/2016  . Toe pain, left 08/26/2015  . 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 Hallux limitus of left foot 08/26/2015  . Abnormal Pap smear of cervix 08/31/2012  . Abnormal Pap smear, atypical squamous cells of undetermined sign (ASC-US) 02/24/2012  . Tinea corporis 08/28/2009  . Disorder of bone and cartilage 10/01/2008  . Allergic rhinitis 09/06/2008  . Benign neoplasm of colon 09/06/2008  . Personal history of colonic polyps 02/19/2008    Scot Jun, PT, DPT, OCS, ATC 02/26/21  10:07 AM    [facility] [address] [place], [place], [address] Phone: [phone]   Fax:  [phone]  Name: Faith Galvan MRN: [id number] Date of Birth: 03-04-1956

## 2021-02-26 NOTE — Progress Notes (Signed)
Chief Complaint:   OBESITY Faith Galvan is here to discuss her progress with her obesity treatment plan along with follow-up of her obesity related diagnoses. Faith Galvan is on the Category 1 Plan and states she is following her eating plan approximately 85% of the time. Faith Galvan states she is doing cardio and strength training and walking for 45 minutes 2 times per week.  Today's visit was #: 2 Starting weight: 186 lbs Starting date: 02/11/2021 Today's weight: 186 lbs Today's date: 02/25/2021 Total lbs lost to date: 0 Total lbs lost since last in-office visit: 0  Interim History: Faith Galvan's weight remains the same as at her first visit.  The bioimpedance scale shows 1 pound of extra water.  She states that she has been to a celebration.  Subjective:   1. Prediabetes Faith Galvan has a diagnosis of prediabetes based on her elevated HgA1c and was informed this puts her at greater risk of developing diabetes. She continues to work on diet and exercise to decrease her risk of diabetes. She denies nausea or hypoglycemia.  A1c 5.8, insulin 12.0.  Lab Results  Component Value Date   HGBA1C 5.8 (H) 02/11/2021   Lab Results  Component Value Date   INSULIN 12.0 02/11/2021   2. Essential hypertension Reasonably well controlled.  Review: taking medications as instructed, no medication side effects noted, no chest pain on exertion, no dyspnea on exertion, no swelling of ankles.    BP Readings from Last 3 Encounters:  02/25/21 (!) 142/86  02/11/21 (!) 145/87  02/03/21 140/86   Assessment/Plan:   1. Prediabetes Faith Galvan will continue to work on weight loss, exercise, and decreasing simple carbohydrates to help decrease the risk of diabetes.  Handout provided on insulin resistance and prediabetes.   2. Essential hypertension Faith Galvan is working on healthy weight loss and exercise to improve blood pressure control. We will watch for signs of hypotension as she continues her lifestyle modifications.   Continue medications.  3. Obesity, current BMI 32  Faith Galvan is currently in the action stage of change. As such, her goal is to continue with weight loss efforts. She has agreed to the Category 1 Plan and keeping a food journal and adhering to recommended goals of 1000 calories and 70-80 grams of protein.   She will work on meal planning.  Protein Equivalents sheet provided today.  Labs from 02/11/2021, including A1c, insulin, and glucose, were reviewed today.  Exercise goals: As is.  Behavioral modification strategies: increasing lean protein intake, decreasing simple carbohydrates, increasing vegetables, increasing water intake, decreasing eating out, no skipping meals, meal planning and cooking strategies, keeping healthy foods in the home and planning for success.  Faith Galvan has agreed to follow-up with our clinic in 2 weeks. She was informed of the importance of frequent follow-up visits to maximize her success with intensive lifestyle modifications for her multiple health conditions.   Objective:   Blood pressure (!) 142/86, pulse 78, temperature 98.2 F (36.8 C), height 5\' 3"  (1.6 m), weight 186 lb (84.4 kg), SpO2 96 %. Body mass index is 32.95 kg/m.  General: Cooperative, alert, well developed, in no acute distress. HEENT: Conjunctivae and lids unremarkable. Cardiovascular: Regular rhythm.  Lungs: Normal work of breathing. Neurologic: No focal deficits.   Lab Results  Component Value Date   CREATININE 0.77 09/03/2019   BUN 18 09/03/2019   NA 138 09/03/2019   K 4.4 09/03/2019   CL 107 09/03/2019   CO2 21 (L) 09/03/2019   Lab Results  Component Value  Date   ALT 25 09/22/2018   AST 15 09/22/2018   ALKPHOS 89 09/22/2018   BILITOT 0.3 09/22/2018   Lab Results  Component Value Date   HGBA1C 5.8 (H) 02/11/2021   Lab Results  Component Value Date   INSULIN 12.0 02/11/2021   Lab Results  Component Value Date   TSH 1.430 09/22/2018   Lab Results  Component Value Date    CHOL 247 (H) 09/22/2018   HDL 80 09/22/2018   LDLCALC 134 (H) 09/22/2018   TRIG 163 (H) 09/22/2018   CHOLHDL 3.1 09/22/2018   Lab Results  Component Value Date   WBC 6.1 09/03/2019   HGB 13.4 09/03/2019   HCT 40.4 09/03/2019   MCV 103.6 (H) 09/03/2019   PLT 258 09/03/2019   Obesity Behavioral Intervention:   Approximately 15 minutes were spent on the discussion below.  ASK: We discussed the diagnosis of obesity with Faith Galvan today and Faith Galvan agreed to give [place] permission to discuss obesity behavioral modification therapy today.  ASSESS: Faith Galvan has the diagnosis of obesity and her BMI today is 33.1. [place] is in the action stage of change.   ADVISE: Faith Galvan was educated on the multiple health risks of obesity as well as the benefit of weight loss to improve her health. She was advised of the need for long term treatment and the importance of lifestyle modifications to improve her current health and to decrease her risk of future health problems.  AGREE: Multiple dietary modification options and treatment options were discussed and Faith Galvan agreed to follow the recommendations documented in the above note.  ARRANGE: Faith Galvan was educated on the importance of frequent visits to treat obesity as outlined per CMS and USPSTF guidelines and agreed to schedule her next follow up appointment today.  Attestation Statements:   Reviewed by clinician on day of visit: allergies, medications, problem list, medical history, surgical history, family history, social history, and previous encounter notes.  I, Water quality scientist, CMA, am acting as Location manager for [employer], DO  I have reviewed the above documentation for accuracy and completeness, and I agree with the above. Faith Lesch, DO

## 2021-02-28 NOTE — Progress Notes (Deleted)
Cardiology Office Note:    Date:  02/28/2021   ID:  Faith Galvan, DOB 1956/02/19, MRN [id number]  PCP:  Valente, Louis R, [place]  Cardiologist:  No primary care provider on file.  Advanced Practice Provider:  No care team member to display Electrophysiologist:  None    Referring MD: Josetta Huddle, MD    History of Present Illness:    Faith Galvan is a 65 y.o. female with a hx of HTN, obesity and mitral valve prolapse who was referred by Dr. Inda Merlin for further evaluation of MVP.  Today,   Past Medical History:  Diagnosis Date  . 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 Allergy PCN  . Arthritis   . H/O blood clots    18 months old - blood clots in right leg  . Hx of adenomatous polyp of rectum 02/19/2008  . Hypertension   . Infertility, female   . MVP (mitral valve prolapse)     Past Surgical History:  Procedure Laterality Date  . CHOLECYSTECTOMY N/A 09/06/2019   Procedure: LAPAROSCOPIC CHOLECYSTECTOMY, UMBILICIAL HERNIA REPAIR;  Surgeon: Coralie Keens, MD;  Location: [place];  Service: General;  Laterality: N/A;  . HYSTEROSCOPY    . TONSILECTOMY/ADENOIDECTOMY WITH MYRINGOTOMY      Current Medications: No outpatient medications have been marked as taking for the 03/03/21 encounter (Appointment) with Freada Bergeron, MD.     Allergies:   Penicillins and Latex   Social History   Socioeconomic History  . Marital status: Widowed    Spouse name: Not on file  . Number of children: Not on file  . Years of education: Not on file  . Highest education level: Not on file  Occupational History  . Occupation: retired  Tobacco Use  . Smoking status: Former Smoker    Types: Cigarettes    Quit date: 08/22/1994    Years since quitting: 26.5  . Smokeless tobacco: Never Used  Vaping Use  . Vaping Use: Never used  Substance and Sexual Activity  . Alcohol use: Yes    Alcohol/week: 5.0 - 7.0 standard drinks     Types: 5 - 7 Standard drinks or equivalent per week  . Drug use: No  . Sexual activity: Not Currently    Birth control/protection: Post-menopausal  Other Topics Concern  . Not on file  Social History Narrative  . Not on file   Social Determinants of Health   Financial Resource Strain: Not on file  Food Insecurity: Not on file  Transportation Needs: Not on file  Physical Activity: Not on file  Stress: Not on file  Social Connections: Not on file     Family History: The patient's ***family history includes Alcohol abuse in her father; Anxiety disorder in her father and mother; Bladder Cancer (age of onset: 41) in her paternal aunt; Cancer in her maternal grandmother, paternal aunt, and paternal uncle; Colon cancer (age of onset: 82) in her cousin; Depression in her father and mother; Diabetes in her father and mother; Heart disease in her paternal grandfather; High Cholesterol in her father and mother; Hypertension in her father and mother; Obesity in her father and mother. There is no history of Colon polyps, Esophageal cancer, Stomach cancer, Rectal cancer, Pancreatic cancer, or Prostate cancer.  ROS:   Please see the history of present illness.    *** All other systems reviewed and are negative.  EKGs/Labs/Other Studies Reviewed:  The following studies were reviewed today: ***  EKG:  EKG is *** ordered today.  The ekg ordered today demonstrates ***  Recent Labs: No results found for requested labs within last 8760 hours.  Recent Lipid Panel    Component Value Date/Time   CHOL 247 (H) 09/22/2018 0933   TRIG 163 (H) 09/22/2018 0933   HDL 80 09/22/2018 0933   CHOLHDL 3.1 09/22/2018 0933   LDLCALC 134 (H) 09/22/2018 0933     Risk Assessment/Calculations:   {Does this patient have ATRIAL FIBRILLATION?:[id number]}   Physical Exam:    VS:  There were no vitals taken for this visit.    Wt Readings from Last 3 Encounters:  02/25/21 186 lb (84.4 kg)  02/11/21 186 lb  (84.4 kg)  02/03/21 189 lb (85.7 kg)     GEN: *** Well nourished, well developed in no acute distress HEENT: Normal NECK: No JVD; No carotid bruits LYMPHATICS: No lymphadenopathy CARDIAC: ***RRR, no murmurs, rubs, gallops RESPIRATORY:  Clear to auscultation without rales, wheezing or rhonchi  ABDOMEN: Soft, non-tender, non-distended MUSCULOSKELETAL:  No edema; No deformity  SKIN: Warm and dry NEUROLOGIC:  Alert and oriented x 3 PSYCHIATRIC:  Normal affect   ASSESSMENT:    No diagnosis found. PLAN:    In order of problems listed above:  #Mitral Valve Prolapse: -Check TTE  #HTN: Controlled. -Continue valsartan-HCTZ  #Obesity: -Follows with healthy weight and weight loss clinic  #Risk stratification: -? Calcium score   {Are you ordering a CV Procedure (e.g. stress test, cath, DCCV, TEE, etc)?   Press F2        :[id number]}    Medication Adjustments/Labs and Tests Ordered: Current medicines are reviewed at length with the patient today.  Concerns regarding medicines are outlined above.  No orders of the defined types were placed in this encounter.  No orders of the defined types were placed in this encounter.   There are no Patient Instructions on file for this visit.   Signed, Freada Bergeron, MD  02/28/2021 10:56 AM    [place]

## 2021-03-03 ENCOUNTER — Other Ambulatory Visit: Payer: Self-pay

## 2021-03-03 ENCOUNTER — Ambulatory Visit: Payer: Medicare Other | Admitting: Cardiology

## 2021-03-03 ENCOUNTER — Ambulatory Visit (INDEPENDENT_AMBULATORY_CARE_PROVIDER_SITE_OTHER): Payer: Medicare Other | Admitting: Rehabilitative and Restorative Service Providers"

## 2021-03-03 ENCOUNTER — Encounter: Payer: Self-pay | Admitting: Rehabilitative and Restorative Service Providers"

## 2021-03-03 DIAGNOSIS — M25561 Pain in right knee: Secondary | ICD-10-CM

## 2021-03-03 DIAGNOSIS — M25552 Pain in left hip: Secondary | ICD-10-CM

## 2021-03-03 DIAGNOSIS — M6281 Muscle weakness (generalized): Secondary | ICD-10-CM | POA: Diagnosis not present

## 2021-03-03 DIAGNOSIS — M25551 Pain in right hip: Secondary | ICD-10-CM

## 2021-03-03 DIAGNOSIS — R262 Difficulty in walking, not elsewhere classified: Secondary | ICD-10-CM

## 2021-03-03 NOTE — Therapy (Signed)
[facility] [address] [place], [place], [address] Phone: [phone]   Fax:  [phone]  Physical Therapy Treatment  Patient Details  Name: Faith Galvan MRN: [id number] Date of Birth: [birth year] Referring Provider (PT): Dr. Ninfa Linden   Encounter Date: 03/03/2021   PT End of Session - 03/03/21 0938    Visit Number 8    Number of Visits 20    Date for PT Re-Evaluation 04/06/21    Authorization Type Medicare 10 weeks POC, KX Modifier 15 visits    Authorization - Visit Number 8    Progress Note Due on Visit 10    PT Start Time 0930    PT Stop Time 1010    PT Time Calculation (min) 40 min    Activity Tolerance Patient tolerated treatment well    Behavior During Therapy [facility] for tasks assessed/performed           Past Medical History:  Diagnosis Date  . 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 Allergy PCN  . Arthritis   . H/O blood clots    18 months old - blood clots in right leg  . Hx of adenomatous polyp of rectum 02/19/2008  . Hypertension   . Infertility, female   . MVP (mitral valve prolapse)     Past Surgical History:  Procedure Laterality Date  . CHOLECYSTECTOMY N/A 09/06/2019   Procedure: LAPAROSCOPIC CHOLECYSTECTOMY, UMBILICIAL HERNIA REPAIR;  Surgeon: Coralie Keens, MD;  Location: [place];  Service: General;  Laterality: N/A;  . HYSTEROSCOPY    . TONSILECTOMY/ADENOIDECTOMY WITH MYRINGOTOMY      There were no vitals filed for this visit.   Subjective Assessment - 03/03/21 0937    Subjective Pt. indicated no pain.  Reported feeling some increased tightness today getting shoe on Rt hip.    Pertinent History Injection Lt hip.    Limitations Walking;Standing;Sitting    Patient Stated Goals Reduce pain, yard work, exercise    Currently in Pain? No/denies    Pain Score 0-No pain    Pain Onset More than a month ago   Dec 2021 Lt hip, Rt hip comes and goes   Pain Score 0    Pain Onset More than a month ago    acute on chronic                            [facility] Adult PT Treatment/Exercise - 03/03/21 0001      Knee/Hip Exercises: Stretches   Other Knee/Hip Stretches supine figure 4 ER overpressure stretch 30 sec x 5 and flexion overpressure stretch 30 sec x 5, performed bilateral    Other Knee/Hip Stretches seated figure 4 stretch 15 sec x 2, trial of shoe on/off x 2      Knee/Hip Exercises: Aerobic   Nustep Lvl 6 10 mins      Knee/Hip Exercises: Sidelying   Other Sidelying Knee/Hip Exercises Rt sidelying Rt hip ER against gravity 20x      Manual Therapy   Manual therapy comments inferior/lateral g4 jt mobs, anterior mobs in prone to Rt hip g4 c ER mobility                    PT Short Term Goals - 02/19/21 1216      PT SHORT TERM GOAL #1   Title Patient will demonstrate independent use of home exercise program to maintain progress from in clinic treatments.  Baseline 3/31: inconsistent compliance to date    Time 3    Period Weeks    Status Achieved    Target Date 02/16/21             PT Long Term Goals - 02/17/21 1010      PT LONG TERM GOAL #1   Title Patient will demonstrate/report pain at worst less than or equal to 2/10 to facilitate minimal limitation in daily activity secondary to pain symptoms.    Time 10    Period Weeks    Status On-going      PT LONG TERM GOAL #2   Title Patient will demonstrate independent use of home exercise program to facilitate ability to maintain/progress functional gains from skilled physical therapy services.    Time 10    Period Weeks    Status On-going      PT LONG TERM GOAL #3   Title Pt. will demonstrate bilateral LE MMT 5/5 throughout s symptoms to facilitate usual standing, walking at PLOF s limitation.    Time 10    Period Weeks    Status On-going      PT LONG TERM GOAL #4   Title Pt. will demonstrate bilateral hip AROM WFL s symptoms to facilitate usual activity at PLOF.    Time 10    Period Weeks     Status On-going      PT LONG TERM GOAL #5   Title Pt. will demonstrate FOTO > or = 59% to indicate reduced disability 2' to condition.    Time 10    Period Weeks    Status On-going      PT LONG TERM GOAL #6   Title Pt. will demonstrate bialteral SLS 15 seconds s trendelenburg to facilitate improved stability in ambulation.    Time 10    Period Weeks    Status On-going                 Plan - 03/03/21 1016    Clinical Impression Statement Rt hip FABER in sitting, supine as well as end range ER most limited today/chief complaint.  Positive gains from prone anterior mobilizations to Rt hip showed posiitive improvements in reassessment of shoe on/off mobility.    Personal Factors and Comorbidities Comorbidity 1    Comorbidities HTN    Examination-Activity Limitations Sit;Squat;Stairs;Stand;Locomotion Level    Examination-Participation Restrictions Community Activity;Yard Work    Stability/Clinical Decision Making Stable/Uncomplicated    Rehab Potential Good    PT Frequency 2x / week    PT Duration Other (comment)   10 weeks   PT Treatment/Interventions ADLs/Self Care Home Management;Cryotherapy;Electrical Stimulation;Iontophoresis 4mg /ml Dexamethasone;Moist Heat;Traction;Balance training;Therapeutic exercise;Therapeutic activities;Functional mobility training;Stair training;Gait training;Patient/family education;DME Instruction;Ultrasound;Neuromuscular re-education;Manual techniques;Taping;Passive range of motion;Dry needling;Spinal Manipulations;Joint Manipulations    PT Next Visit Plan Prone anterior mobilzations to Rt hip, mobilization c movement for ER.    PT Home Exercise Plan [id number]    Consulted and Agree with Plan of Care Patient           Patient will benefit from skilled therapeutic intervention in order to improve the following deficits and impairments:  Hypomobility,Pain,Increased fascial restricitons,Decreased strength,Decreased activity tolerance,Difficulty  walking,Impaired perceived functional ability,Decreased range of motion,Decreased balance,Decreased mobility,Impaired flexibility,Decreased coordination  Visit Diagnosis: Pain in left hip  Pain in right hip  Muscle weakness (generalized)  Right knee pain, unspecified chronicity  Difficulty in walking, not elsewhere classified     Problem List Patient Active Problem List   Diagnosis Date Noted  .  Prediabetes 02/12/2021  . Hypertension 02/03/2021  . H/O blood clots   . S/P laparoscopic cholecystectomy 09/06/2019  . Osteopenia 03/15/2018  . Intolerant of heat 03/15/2018  . Low back pain 03/15/2018  . Menopausal symptom 03/15/2018  . Urinary tract infectious disease 03/15/2018  . Sprain of ankle 06/24/2016  . Avulsion of right ankle 02/24/2016  . Toe pain, left 08/26/2015  . 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 Hallux limitus of left foot 08/26/2015  . Class 1 obesity due to excess calories with body mass index (BMI) of 32.0 to 32.9 in adult 08/31/2012  . Abnormal Pap smear, atypical squamous cells of undetermined sign (ASC-US) 02/24/2012  . Tinea corporis 08/28/2009  . Disorder of bone and cartilage 10/01/2008  . Allergic rhinitis 09/06/2008  . Benign neoplasm of colon 09/06/2008  . Personal history of colonic polyps 02/19/2008    Scot Jun, PT, DPT, OCS, ATC 03/03/21  10:17 AM    [facility] Physical Therapy [address] [place], [place], [address] Phone: [phone]   Fax:  [phone]  Name: Faith Galvan MRN: [id number] Date of Birth: 03/15/1956

## 2021-03-03 NOTE — Progress Notes (Incomplete)
Cardiology Office Note:    Date:  03/03/2021   ID:  Faith Galvan, DOB May 23, 1956, MRN [id number]  PCP:  Valente, Louis R, Han[place]  Cardiologist:  No primary care provider on file.  Advanced Practice Provider:  No care team member to display Electrophysiologist:  None    Referring MD: Josetta Huddle, MD    History of Present Illness:    Faith Galvan is a 65 y.o. female with a hx of HTN, obesity and mitral valve prolapse who was referred by Dr. Inda Merlin for further evaluation of MVP.  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   Allergy PCN   Arthritis    H/O blood clots    19 months old - blood clots in right leg   Hx of adenomatous polyp of rectum 02/19/2008   Hypertension    Infertility, female    MVP (mitral valve prolapse)     Past Surgical History:  Procedure Laterality Date   CHOLECYSTECTOMY N/A 09/06/2019   Procedure: LAPAROSCOPIC CHOLECYSTECTOMY, UMBILICIAL HERNIA REPAIR;  Surgeon: Coralie Keens, MD;  Location: [place];  Service: General;  Laterality: N/A;   HYSTEROSCOPY     TONSILECTOMY/ADENOIDECTOMY WITH MYRINGOTOMY      Current Medications: No outpatient medications have been marked as taking for the 03/03/21 encounter (Appointment) with Freada Bergeron, MD.     Allergies:   Penicillins and Latex   Social History   Socioeconomic History   Marital status: Widowed    Spouse name: Not on file   Number of children: Not on file   Years of education: Not on file   Highest education level: Not on file  Occupational History   Occupation: retired  Tobacco Use   Smoking status: Former Smoker    Types: Cigarettes    Quit date: 08/22/1994    Years since quitting: 26.5   Smokeless tobacco: Never Used  Vaping Use   Vaping Use: Never used  Substance and Sexual Activity   Alcohol use: Yes    Alcohol/week: 5.0 - 7.0 standard drinks     Types: 5 - 7 Standard drinks or equivalent per week   Drug use: No   Sexual activity: Not Currently    Birth control/protection: Post-menopausal  Other Topics Concern   Not on file  Social History Narrative   Not on file   Social Determinants of Health   Financial Resource Strain: Not on file  Food Insecurity: Not on file  Transportation Needs: Not on file  Physical Activity: Not on file  Stress: Not on file  Social Connections: Not on file     Family History: The patient's family history includes Alcohol abuse in her father; Anxiety disorder in her father and mother; Bladder Cancer (age of onset: 75) in her paternal aunt; Cancer in her maternal grandmother, paternal aunt, and paternal uncle; Colon cancer (age of onset: 49) in her cousin; Depression in her father and mother; Diabetes in her father and mother; Heart disease in her paternal grandfather; High Cholesterol in her father and mother; Hypertension in her father and mother; Obesity in her father and mother. There is no history of Colon polyps, Esophageal cancer, Stomach cancer, Rectal cancer, Pancreatic cancer, or Prostate cancer.  ROS:   Please see the history of present illness.    (+) All other systems reviewed and are negative.  EKGs/Labs/Other Studies Reviewed:  The following studies were reviewed today:   EKG:   03/03/2021:  EKG is not ordered today.    Recent Labs: No results found for requested labs within last 8760 hours.  Recent Lipid Panel    Component Value Date/Time   CHOL 247 (H) 09/22/2018 0933   TRIG 163 (H) 09/22/2018 0933   HDL 80 09/22/2018 0933   CHOLHDL 3.1 09/22/2018 0933   LDLCALC 134 (H) 09/22/2018 0933     Risk Assessment/Calculations:   {Does this patient have ATRIAL FIBRILLATION?:[phone]}   Physical Exam:    VS:  There were no vitals taken for this visit.    Wt Readings from Last 3 Encounters:  02/25/21 186 lb (84.4 kg)  02/11/21 186 lb (84.4 kg)  02/03/21 189 lb  (85.7 kg)     GEN: Well nourished, well developed in no acute distress HEENT: Normal NECK: No JVD; No carotid bruits LYMPHATICS: No lymphadenopathy CARDIAC: RRR, no murmurs, rubs, gallops RESPIRATORY:  Clear to auscultation without rales, wheezing or rhonchi  ABDOMEN: Soft, non-tender, non-distended MUSCULOSKELETAL:  No edema; No deformity  SKIN: Warm and dry NEUROLOGIC:  Alert and oriented x 3 PSYCHIATRIC:  Normal affect   ASSESSMENT:    No diagnosis found. PLAN:    In order of problems listed above:  #Mitral Valve Prolapse: -Check TTE  #HTN: Controlled. -Continue valsartan-HCTZ  #Obesity: -Follows with healthy weight and weight loss clinic  #Risk stratification: -? Calcium score   {Are you ordering a CV Procedure (e.g. stress test, cath, DCCV, TEE, etc)?   Press F2        :[id number]}    Medication Adjustments/Labs and Tests Ordered: Current medicines are reviewed at length with the patient today.  Concerns regarding medicines are outlined above.  No orders of the defined types were placed in this encounter.  No orders of the defined types were placed in this encounter.   There are no Patient Instructions on file for this visit.   Follow-up in  [facility],acting as a scribe for Freada Bergeron, MD.,have documented all relevant documentation on the behalf of Freada Bergeron, MD,as directed by  Freada Bergeron, MD while in the presence of Freada Bergeron, MD.  ***  Signed, Madelin Rear  03/03/2021 12:43 PM    [place]

## 2021-03-06 ENCOUNTER — Encounter: Payer: Medicare Other | Admitting: Rehabilitative and Restorative Service Providers"

## 2021-03-10 ENCOUNTER — Encounter: Payer: Self-pay | Admitting: Rehabilitative and Restorative Service Providers"

## 2021-03-10 ENCOUNTER — Ambulatory Visit (INDEPENDENT_AMBULATORY_CARE_PROVIDER_SITE_OTHER): Payer: Medicare Other | Admitting: Rehabilitative and Restorative Service Providers"

## 2021-03-10 ENCOUNTER — Other Ambulatory Visit: Payer: Self-pay

## 2021-03-10 DIAGNOSIS — M25552 Pain in left hip: Secondary | ICD-10-CM

## 2021-03-10 DIAGNOSIS — M25561 Pain in right knee: Secondary | ICD-10-CM | POA: Diagnosis not present

## 2021-03-10 DIAGNOSIS — M25551 Pain in right hip: Secondary | ICD-10-CM | POA: Diagnosis not present

## 2021-03-10 DIAGNOSIS — M6281 Muscle weakness (generalized): Secondary | ICD-10-CM | POA: Diagnosis not present

## 2021-03-10 DIAGNOSIS — R262 Difficulty in walking, not elsewhere classified: Secondary | ICD-10-CM

## 2021-03-10 NOTE — Therapy (Signed)
[facility] Therapy [address] [place], [place], [address] Phone: [phone]   Fax:  [phone]  Physical Therapy Treatment  Patient Details  Name: Faith Galvan MRN: [id number] Date of Birth: 09-12-1956 Referring Provider (PT): Dr. Ninfa Linden   Encounter Date: 03/10/2021   PT End of Session - 03/10/21 0958    Visit Number 9    Number of Visits 20    Date for PT Re-Evaluation 04/06/21    Authorization Type Medicare 10 weeks POC, KX Modifier 15 visits    Authorization - Visit Number 9    Progress Note Due on Visit 10    PT Start Time 0926    PT Stop Time 1005    PT Time Calculation (min) 39 min    Activity Tolerance Patient tolerated treatment well    Behavior During Therapy [facility] for tasks assessed/performed           Past Medical History:  Diagnosis Date  . 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 Allergy PCN  . Arthritis   . H/O blood clots    18 months old - blood clots in right leg  . Hx of adenomatous polyp of rectum 02/19/2008  . Hypertension   . Infertility, female   . MVP (mitral valve prolapse)     Past Surgical History:  Procedure Laterality Date  . CHOLECYSTECTOMY N/A 09/06/2019   Procedure: LAPAROSCOPIC CHOLECYSTECTOMY, UMBILICIAL HERNIA REPAIR;  Surgeon: Coralie Keens, MD;  Location: [place];  Service: General;  Laterality: N/A;  . HYSTEROSCOPY    . TONSILECTOMY/ADENOIDECTOMY WITH MYRINGOTOMY      There were no vitals filed for this visit.   Subjective Assessment - 03/10/21 0929    Subjective Pt. stated her Rt groin area has been really bothering her.  Pt. stated hot shower helped. Indicated doing mowing and work this weekend and didn't specifically feel pain increase in the moement for activity.  Lt side is good.    Pertinent History Injection Lt hip.    Limitations Walking;Standing;Sitting    Patient Stated Goals Reduce pain, yard work, exercise    Currently in Pain? Yes    Pain Score 5    last night  7/10   Pain Location Hip    Pain Orientation Right    Pain Descriptors / Indicators Aching    Pain Onset More than a month ago   Dec 2021 Lt hip, Rt hip comes and goes   Aggravating Factors  worsened after activity during weekend.    Pain Relieving Factors rest    Pain Onset More than a month ago   acute on chronic             [facility] PT Assessment - 03/10/21 0001      Assessment   Medical Diagnosis Bilateral hip pain, Rt knee pain    Referring Provider (PT) Dr. Ninfa Linden    Onset Date/Surgical Date 10/15/20    Hand Dominance Right      PROM   Right Hip Flexion 120    Right Hip External Rotation  45   in prone   Right Hip Internal Rotation  46   in prone   Left Hip External Rotation  50   in prone                        [facility] Adult PT Treatment/Exercise - 03/10/21 0001      Knee/Hip Exercises: Stretches   Other Knee/Hip  Stretches supine single knee to chest 15 sec x 5 bilateral, supine figure 4 ER pulling to and pushing away 15 sec x 5 bilateral      Knee/Hip Exercises: Aerobic   Nustep Lvl 6 10 mins      Manual Therapy   Manual therapy comments inferior Rt hip joint glides g3-g4, anterior glides g3-g4 in ER movement in prone                    PT Short Term Goals - 02/19/21 1216      PT SHORT TERM GOAL #1   Title Patient will demonstrate independent use of home exercise program to maintain progress from in clinic treatments.    Baseline 3/31: inconsistent compliance to date    Time 3    Period Weeks    Status Achieved    Target Date 02/16/21             PT Long Term Goals - 02/17/21 1010      PT LONG TERM GOAL #1   Title Patient will demonstrate/report pain at worst less than or equal to 2/10 to facilitate minimal limitation in daily activity secondary to pain symptoms.    Time 10    Period Weeks    Status On-going      PT LONG TERM GOAL #2   Title Patient will demonstrate independent use of home exercise program to facilitate  ability to maintain/progress functional gains from skilled physical therapy services.    Time 10    Period Weeks    Status On-going      PT LONG TERM GOAL #3   Title Pt. will demonstrate bilateral LE MMT 5/5 throughout s symptoms to facilitate usual standing, walking at PLOF s limitation.    Time 10    Period Weeks    Status On-going      PT LONG TERM GOAL #4   Title Pt. will demonstrate bilateral hip AROM WFL s symptoms to facilitate usual activity at PLOF.    Time 10    Period Weeks    Status On-going      PT LONG TERM GOAL #5   Title Pt. will demonstrate FOTO > or = 59% to indicate reduced disability 2' to condition.    Time 10    Period Weeks    Status On-going      PT LONG TERM GOAL #6   Title Pt. will demonstrate bialteral SLS 15 seconds s trendelenburg to facilitate improved stability in ambulation.    Time 10    Period Weeks    Status On-going                 Plan - 03/10/21 0956    Clinical Impression Statement Although improvements in passive movement for Rt hip have been noted, continued presentation implicating Rt hip joint pathology as option was present.  Pt. has seemed to beneift from hip joint mobilizations for management and maintaining presentation to prevent worsening.  Pt. chief complaint of putting on/off shoes/socks still evident, improved in clinic time post manual but continued difficulty at home noted.    Personal Factors and Comorbidities Comorbidity 1    Comorbidities HTN    Examination-Activity Limitations Sit;Squat;Stairs;Stand;Locomotion Level    Examination-Participation Restrictions Community Activity;Yard Work    Stability/Clinical Decision Making Stable/Uncomplicated    Rehab Potential Good    PT Frequency 2x / week    PT Duration Other (comment)   10 weeks   PT Treatment/Interventions  ADLs/Self Care Home Management;Cryotherapy;Electrical Stimulation;Iontophoresis 4mg /ml Dexamethasone;Moist Heat;Traction;Balance training;Therapeutic  exercise;Therapeutic activities;Functional mobility training;Stair training;Gait training;Patient/family education;DME Instruction;Ultrasound;Neuromuscular re-education;Manual techniques;Taping;Passive range of motion;Dry needling;Spinal Manipulations;Joint Manipulations    PT Next Visit Plan ER, flexion Rt hip mobilizations for movement improvement.  Possible return to MD for hip joint pathology evaluation (imaging?)    PT Home Exercise Plan [phone]    Consulted and Agree with Plan of Care Patient           Patient will benefit from skilled therapeutic intervention in order to improve the following deficits and impairments:  Hypomobility,Pain,Increased fascial restricitons,Decreased strength,Decreased activity tolerance,Difficulty walking,Impaired perceived functional ability,Decreased range of motion,Decreased balance,Decreased mobility,Impaired flexibility,Decreased coordination  Visit Diagnosis: Pain in left hip  Pain in right hip  Muscle weakness (generalized)  Right knee pain, unspecified chronicity  Difficulty in walking, not elsewhere classified     Problem List Patient Active Problem List   Diagnosis Date Noted  . Prediabetes 02/12/2021  . Hypertension 02/03/2021  . H/O blood clots   . S/P laparoscopic cholecystectomy 09/06/2019  . Osteopenia 03/15/2018  . Intolerant of heat 03/15/2018  . Low back pain 03/15/2018  . Menopausal symptom 03/15/2018  . Urinary tract infectious disease 03/15/2018  . Sprain of ankle 06/24/2016  . Avulsion of right ankle 02/24/2016  . Toe pain, left 08/26/2015  . 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 Hallux limitus of left foot 08/26/2015  . Class 1 obesity due to excess calories with body mass index (BMI) of 32.0 to 32.9 in adult 08/31/2012  . Abnormal Pap smear, atypical squamous cells of undetermined sign (ASC-US) 02/24/2012  . Tinea corporis 08/28/2009  . Disorder of bone and cartilage 10/01/2008  . Allergic  rhinitis 09/06/2008  . Benign neoplasm of colon 09/06/2008  . Personal history of colonic polyps 02/19/2008   Scot Jun, PT, DPT, OCS, ATC 03/10/21  9:59 AM    [facility] Physical Therapy [address] [place], [place], [address] Phone: [phone]   Fax:  [phone]  Name: Faith Galvan MRN: [id number] Date of Birth: 1956-07-18

## 2021-03-11 ENCOUNTER — Ambulatory Visit (INDEPENDENT_AMBULATORY_CARE_PROVIDER_SITE_OTHER): Payer: Medicare Other | Admitting: Family Medicine

## 2021-03-12 ENCOUNTER — Other Ambulatory Visit: Payer: Self-pay

## 2021-03-12 ENCOUNTER — Encounter: Payer: Self-pay | Admitting: Rehabilitative and Restorative Service Providers"

## 2021-03-12 ENCOUNTER — Ambulatory Visit (INDEPENDENT_AMBULATORY_CARE_PROVIDER_SITE_OTHER): Payer: Medicare Other | Admitting: Rehabilitative and Restorative Service Providers"

## 2021-03-12 DIAGNOSIS — M25561 Pain in right knee: Secondary | ICD-10-CM | POA: Diagnosis not present

## 2021-03-12 DIAGNOSIS — M6281 Muscle weakness (generalized): Secondary | ICD-10-CM | POA: Diagnosis not present

## 2021-03-12 DIAGNOSIS — M25551 Pain in right hip: Secondary | ICD-10-CM

## 2021-03-12 DIAGNOSIS — M25552 Pain in left hip: Secondary | ICD-10-CM | POA: Diagnosis not present

## 2021-03-12 DIAGNOSIS — R262 Difficulty in walking, not elsewhere classified: Secondary | ICD-10-CM

## 2021-03-12 NOTE — Therapy (Signed)
[facility] Physical Therapy [address] [place], [place], [address] Phone: [phone]   Fax:  [phone]  Physical Therapy Treatment/Progress Note  Patient Details  Name: Faith Galvan MRN: [id number] Date of Birth: 01-Apr-1956 Referring Provider (PT): Dr. Ninfa Linden   Encounter Date: 03/12/2021   Progress Note Reporting Period 01/26/2021 to 03/12/2021  See note below for Objective Data and Assessment of Progress/Goals.        PT End of Session - 03/12/21 0926    Visit Number 10    Number of Visits 20    Date for PT Re-Evaluation 04/06/21    Authorization Type Medicare 10 weeks POC, KX Modifier 15 visits    Authorization - Visit Number 10    Progress Note Due on Visit 20    PT Start Time 0927    PT Stop Time 1010    PT Time Calculation (min) 43 min    Activity Tolerance Patient tolerated treatment well    Behavior During Therapy WFL for tasks assessed/performed           Past Medical History:  Diagnosis Date  . 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 Allergy PCN  . Arthritis   . H/O blood clots    18 months old - blood clots in right leg  . Hx of adenomatous polyp of rectum 02/19/2008  . Hypertension   . Infertility, female   . MVP (mitral valve prolapse)     Past Surgical History:  Procedure Laterality Date  . CHOLECYSTECTOMY N/A 09/06/2019   Procedure: LAPAROSCOPIC CHOLECYSTECTOMY, UMBILICIAL HERNIA REPAIR;  Surgeon: Coralie Keens, MD;  Location: [facility];  Service: General;  Laterality: N/A;  . HYSTEROSCOPY    . TONSILECTOMY/ADENOIDECTOMY WITH MYRINGOTOMY      There were no vitals filed for this visit.   Subjective Assessment - 03/12/21 0934    Subjective Global rating of change +5 Lt hip, Rt hip -2.   Pt. has indicated Rt hip continued to be troublesome, using ice every night.  Had better movement after visit but still hard to get shoe/sock on/off.  Pt. stated Lt hip continued to be showing better.    Pertinent History  Injection Lt hip.    Limitations Walking;Standing;Sitting    Patient Stated Goals Reduce pain, yard work, exercise    Currently in Pain? Yes    Pain Score 6     Pain Location Hip    Pain Orientation Right;Anterior    Pain Descriptors / Indicators Aching;Tightness    Pain Type Chronic pain    Pain Onset More than a month ago   Dec 2021 Lt hip, Rt hip comes and goes   Aggravating Factors  daily ache, tightness c hip movement    Pain Relieving Factors rest, advil, icing, treatment improved movement    Pain Onset More than a month ago   acute on chronic             [facility] PT Assessment - 03/12/21 0001      Assessment   Medical Diagnosis Bilateral hip pain, Rt knee pain    Referring Provider (PT) Dr. Ninfa Linden    Onset Date/Surgical Date 10/15/20    Hand Dominance Right      Observation/Other Assessments   Focus on Therapeutic Outcomes (FOTO)  55% update (eval was 48% risk adjusted, 58 % actual)      Single Leg Stance   Comments Rt SLS 10 seconds, Lt SLS 15 seconds  AROM   Right Hip External Rotation  30   measured in sitting   Right Hip Internal Rotation  36   measured in sitting   Left Hip External Rotation  28   measured in sitting   Left Hip Internal Rotation  30   measured in sitting     PROM   Right Hip Flexion 120    Right Hip External Rotation  45   measured in prone   Right Hip Internal Rotation  46   measured in prone   Left Hip External Rotation  50   measured in prone     Strength   Right Hip Flexion 5/5    Right Hip ABduction 4/5    Left Hip Flexion 5/5    Left Hip ABduction 4+/5    Right Knee Flexion 5/5    Right Knee Extension 5/5    Left Knee Flexion 5/5    Left Knee Extension 5/5                         [facility] Adult PT Treatment/Exercise - 03/12/21 0001      Knee/Hip Exercises: Aerobic   Nustep Lvl 6 6 mins      Knee/Hip Exercises: Standing   Other Standing Knee Exercises lateral stepping 3 cones x 8 bilateral      Knee/Hip  Exercises: Supine   Bridges 15 reps;Other (comment);Both   5 sec hold     Knee/Hip Exercises: Sidelying   Hip ABduction Strengthening;Both;2 sets;10 reps      Manual Therapy   Manual therapy comments inferior Rt hip joint glides g3-g4                    PT Short Term Goals - 02/19/21 1216      PT SHORT TERM GOAL #1   Title Patient will demonstrate independent use of home exercise program to maintain progress from in clinic treatments.    Baseline 3/31: inconsistent compliance to date    Time 3    Period Weeks    Status Achieved    Target Date 02/16/21             PT Long Term Goals - 03/12/21 1000      PT LONG TERM GOAL #1   Title Patient will demonstrate/report pain at worst less than or equal to 2/10 to facilitate minimal limitation in daily activity secondary to pain symptoms.    Time 10    Period Weeks    Status On-going    Target Date 04/06/21      PT LONG TERM GOAL #2   Title Patient will demonstrate independent use of home exercise program to facilitate ability to maintain/progress functional gains from skilled physical therapy services.    Time 10    Period Weeks    Status On-going    Target Date 04/06/21      PT LONG TERM GOAL #3   Title Pt. will demonstrate bilateral LE MMT 5/5 throughout s symptoms to facilitate usual standing, walking at PLOF s limitation.    Time 10    Period Weeks    Status Partially Met    Target Date 04/06/21      PT LONG TERM GOAL #4   Title Pt. will demonstrate bilateral hip AROM WFL s symptoms to facilitate usual activity at PLOF.    Time 10    Period Weeks    Status Partially Met  Target Date 04/06/21      PT LONG TERM GOAL #5   Title Pt. will demonstrate FOTO > or = 59% to indicate reduced disability 2' to condition.    Time 10    Period Weeks    Status On-going    Target Date 04/06/21      PT LONG TERM GOAL #6   Title Pt. will demonstrate bialteral SLS 15 seconds s trendelenburg to facilitate improved  stability in ambulation.    Time 10    Period Weeks    Status Partially Met    Target Date 04/06/21                 Plan - 03/12/21 0951    Clinical Impression Statement Pt. has attended 10 visits overall in course of treatment.  GROC +5 for Lt hip, -2 for Rt hip (indicative of increased anterior Rt hip joint pain in last few weeks).  See objective data for updated information.  Pt. currently has demonstrated good progress and improved symptoms in Lt hip.  Rt hip joint mobility deficits c pain associated seemes to be indicative of possible Rt hip joint pathology and has shown to be more limited than any other indicated symptoms from evaluation.  Pt. to benefit from continued skilled PT services to address impairments/symptoms as well as return to MD in next few weeks.    Personal Factors and Comorbidities Comorbidity 1    Comorbidities HTN    Examination-Activity Limitations Sit;Squat;Stairs;Stand;Locomotion Level    Examination-Participation Restrictions Community Activity;Yard Work    Stability/Clinical Decision Making Stable/Uncomplicated    Rehab Potential Good    PT Frequency 2x / week    PT Duration Other (comment)   10 weeks   PT Treatment/Interventions ADLs/Self Care Home Management;Cryotherapy;Electrical Stimulation;Iontophoresis 77m/ml Dexamethasone;Moist Heat;Traction;Balance training;Therapeutic exercise;Therapeutic activities;Functional mobility training;Stair training;Gait training;Patient/family education;DME Instruction;Ultrasound;Neuromuscular re-education;Manual techniques;Taping;Passive range of motion;Dry needling;Spinal Manipulations;Joint Manipulations    PT Next Visit Plan ER, flexion Rt hip mobilizations for movement improvement, continue posterior/lateral hip strengthening    PT Home Exercise Plan [phone]   Consulted and Agree with Plan of Care Patient           Patient will benefit from skilled therapeutic intervention in order to improve the following  deficits and impairments:  Hypomobility,Pain,Increased fascial restricitons,Decreased strength,Decreased activity tolerance,Difficulty walking,Impaired perceived functional ability,Decreased range of motion,Decreased balance,Decreased mobility,Impaired flexibility,Decreased coordination  Visit Diagnosis: Pain in left hip  Pain in right hip  Muscle weakness (generalized)  Right knee pain, unspecified chronicity  Difficulty in walking, not elsewhere classified     Problem List Patient Active Problem List   Diagnosis Date Noted  . Prediabetes 02/12/2021  . Hypertension 02/03/2021  . H/O blood clots   . S/P laparoscopic cholecystectomy 09/06/2019  . Osteopenia 03/15/2018  . Intolerant of heat 03/15/2018  . Low back pain 03/15/2018  . Menopausal symptom 03/15/2018  . Urinary tract infectious disease 03/15/2018  . Sprain of ankle 06/24/2016  . Avulsion of right ankle 02/24/2016  . Toe pain, left 08/26/2015  . 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 Hallux limitus of left foot 08/26/2015  . Class 1 obesity due to excess calories with body mass index (BMI) of 32.0 to 32.9 in adult 08/31/2012  . Abnormal Pap smear, atypical squamous cells of undetermined sign (ASC-US) 02/24/2012  . Tinea corporis 08/28/2009  . Disorder of bone and cartilage 10/01/2008  . Allergic rhinitis 09/06/2008  . Benign neoplasm of colon 09/06/2008  .  Personal history of colonic polyps 02/19/2008    Scot Jun, PT, DPT, OCS, ATC 03/12/21  10:07 AM    [facility] Physical Therapy [address] [place], [place], [address] Phone: [phone]   Fax:  [phone]  Name: Faith Galvan MRN: [id number] Date of Birth: Aug 29, 1956

## 2021-03-16 DIAGNOSIS — E559 Vitamin D deficiency, unspecified: Secondary | ICD-10-CM | POA: Insufficient documentation

## 2021-03-17 ENCOUNTER — Encounter (INDEPENDENT_AMBULATORY_CARE_PROVIDER_SITE_OTHER): Payer: Self-pay | Admitting: Family Medicine

## 2021-03-17 ENCOUNTER — Ambulatory Visit (INDEPENDENT_AMBULATORY_CARE_PROVIDER_SITE_OTHER): Payer: Medicare Other | Admitting: Rehabilitative and Restorative Service Providers"

## 2021-03-17 ENCOUNTER — Encounter: Payer: Self-pay | Admitting: Rehabilitative and Restorative Service Providers"

## 2021-03-17 ENCOUNTER — Other Ambulatory Visit: Payer: Self-pay

## 2021-03-17 ENCOUNTER — Ambulatory Visit (INDEPENDENT_AMBULATORY_CARE_PROVIDER_SITE_OTHER): Payer: Medicare Other | Admitting: Family Medicine

## 2021-03-17 VITALS — BP 133/77 | HR 65 | Temp 98.1°F | Ht 63.0 in | Wt 186.0 lb

## 2021-03-17 DIAGNOSIS — E669 Obesity, unspecified: Secondary | ICD-10-CM | POA: Diagnosis not present

## 2021-03-17 DIAGNOSIS — M25561 Pain in right knee: Secondary | ICD-10-CM | POA: Diagnosis not present

## 2021-03-17 DIAGNOSIS — I10 Essential (primary) hypertension: Secondary | ICD-10-CM | POA: Diagnosis not present

## 2021-03-17 DIAGNOSIS — M2550 Pain in unspecified joint: Secondary | ICD-10-CM

## 2021-03-17 DIAGNOSIS — M25552 Pain in left hip: Secondary | ICD-10-CM

## 2021-03-17 DIAGNOSIS — R61 Generalized hyperhidrosis: Secondary | ICD-10-CM | POA: Diagnosis not present

## 2021-03-17 DIAGNOSIS — R7303 Prediabetes: Secondary | ICD-10-CM

## 2021-03-17 DIAGNOSIS — M6281 Muscle weakness (generalized): Secondary | ICD-10-CM | POA: Diagnosis not present

## 2021-03-17 DIAGNOSIS — M25551 Pain in right hip: Secondary | ICD-10-CM | POA: Diagnosis not present

## 2021-03-17 DIAGNOSIS — R262 Difficulty in walking, not elsewhere classified: Secondary | ICD-10-CM

## 2021-03-17 DIAGNOSIS — Z6832 Body mass index (BMI) 32.0-32.9, adult: Secondary | ICD-10-CM | POA: Diagnosis not present

## 2021-03-17 NOTE — Therapy (Signed)
[facility] Physical Therapy [address] [place], [place], [address] Phone: [phone]   Fax:  [phone]  Physical Therapy Treatment  Patient Details  Name: Faith Galvan MRN: [id number] Date of Birth: [birth year] Referring Provider (PT): Dr. Ninfa Linden   Encounter Date: 03/17/2021   PT End of Session - 03/17/21 0929    Visit Number 11    Number of Visits 20    Date for PT Re-Evaluation 04/06/21    Authorization Type Medicare 10 weeks POC, KX Modifier 15 visits    Authorization - Visit Number 11    Progress Note Due on Visit 20    PT Start Time 0922    PT Stop Time 1002    PT Time Calculation (min) 40 min    Activity Tolerance Patient tolerated treatment well    Behavior During Therapy [facility] for tasks assessed/performed           Past Medical History:  Diagnosis Date  . 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 Allergy PCN  . Arthritis   . H/O blood clots    18 months old - blood clots in right leg  . Hx of adenomatous polyp of rectum 02/19/2008  . Hypertension   . Infertility, female   . MVP (mitral valve prolapse)     Past Surgical History:  Procedure Laterality Date  . CHOLECYSTECTOMY N/A 09/06/2019   Procedure: LAPAROSCOPIC CHOLECYSTECTOMY, UMBILICIAL HERNIA REPAIR;  Surgeon: Coralie Keens, MD;  Location: [facility];  Service: General;  Laterality: N/A;  . HYSTEROSCOPY    . TONSILECTOMY/ADENOIDECTOMY WITH MYRINGOTOMY      There were no vitals filed for this visit.   Subjective Assessment - 03/17/21 0926    Subjective Pt. indicated having 8/10 in Rt hip at times this morning.  Pt. stated having multiple days of better with ability to put shoes on since last visit.  Return of symptoms noted starting yesterday.    Pertinent History Injection Lt hip.    Limitations Walking;Standing;Sitting    Patient Stated Goals Reduce pain, yard work, exercise    Currently in Pain? Yes    Pain Score 8    at worst   Pain Location Hip    Pain  Orientation Right;Anterior   groin   Pain Descriptors / Indicators Aching   deep   Pain Onset More than a month ago   Dec 2021 Lt hip, Rt hip comes and goes   Pain Frequency Intermittent    Aggravating Factors  unsure why hurting more in last 24 hours    Pain Relieving Factors stretching, manual treatment has helped.    Pain Score 0    Pain Onset More than a month ago   acute on chronic                            [facility] Adult PT Treatment/Exercise - 03/17/21 0001      Knee/Hip Exercises: Stretches   Other Knee/Hip Stretches supine single knee to chest 15 sec x 5 bilateral, supine figure 4 ER pulling to and pushing away 15 sec x 5 bilateral      Knee/Hip Exercises: Machines for Strengthening   Total Gym Leg Press single leg 3 x 10 50 lbs bilateral      Knee/Hip Exercises: Supine   Bridges 15 reps;Both   5 sec hold     Manual Therapy   Manual therapy comments inferior Rt hip joint glides  g3-g4, posterior along femur shaft Rt hip joint mobs G3                    PT Short Term Goals - 02/19/21 1216      PT SHORT TERM GOAL #1   Title Patient will demonstrate independent use of home exercise program to maintain progress from in clinic treatments.    Baseline 3/31: inconsistent compliance to date    Time 3    Period Weeks    Status Achieved    Target Date 02/16/21             PT Long Term Goals - 03/12/21 1000      PT LONG TERM GOAL #1   Title Patient will demonstrate/report pain at worst less than or equal to 2/10 to facilitate minimal limitation in daily activity secondary to pain symptoms.    Time 10    Period Weeks    Status On-going    Target Date 04/06/21      PT LONG TERM GOAL #2   Title Patient will demonstrate independent use of home exercise program to facilitate ability to maintain/progress functional gains from skilled physical therapy services.    Time 10    Period Weeks    Status On-going    Target Date 04/06/21      PT LONG  TERM GOAL #3   Title Pt. will demonstrate bilateral LE MMT 5/5 throughout s symptoms to facilitate usual standing, walking at PLOF s limitation.    Time 10    Period Weeks    Status Partially Met    Target Date 04/06/21      PT LONG TERM GOAL #4   Title Pt. will demonstrate bilateral hip AROM WFL s symptoms to facilitate usual activity at PLOF.    Time 10    Period Weeks    Status Partially Met    Target Date 04/06/21      PT LONG TERM GOAL #5   Title Pt. will demonstrate FOTO > or = 59% to indicate reduced disability 2' to condition.    Time 10    Period Weeks    Status On-going    Target Date 04/06/21      PT LONG TERM GOAL #6   Title Pt. will demonstrate bialteral SLS 15 seconds s trendelenburg to facilitate improved stability in ambulation.    Time 10    Period Weeks    Status Partially Met    Target Date 04/06/21                 Plan - 03/17/21 0946    Clinical Impression Statement Quality of Rt hip flexion mobility improved c reduced symptoms as compared to previous visits.  Pt. presentation also revealed mild reduction in joint mobility restriction in inferior glides on Rt hip as well.  Continued emphasis on hip joint mobility gains in clinic and in HEP to promote mobility gains/maintenance.    Personal Factors and Comorbidities Comorbidity 1    Comorbidities HTN    Examination-Activity Limitations Sit;Squat;Stairs;Stand;Locomotion Level    Examination-Participation Restrictions Community Activity;Yard Work    Stability/Clinical Decision Making Stable/Uncomplicated    Rehab Potential Good    PT Frequency 2x / week    PT Duration Other (comment)   10 weeks   PT Treatment/Interventions ADLs/Self Care Home Management;Cryotherapy;Electrical Stimulation;Iontophoresis 39m/ml Dexamethasone;Moist Heat;Traction;Balance training;Therapeutic exercise;Therapeutic activities;Functional mobility training;Stair training;Gait training;Patient/family education;DME  Instruction;Ultrasound;Neuromuscular re-education;Manual techniques;Taping;Passive range of motion;Dry needling;Spinal Manipulations;Joint Manipulations  PT Next Visit Plan ER, flexion Rt hip mobilizations for movement improvement, continue posterior/lateral hip strengthening  A few more visits until MD visit    PT Home Exercise Plan [phone]    Consulted and Agree with Plan of Care Patient           Patient will benefit from skilled therapeutic intervention in order to improve the following deficits and impairments:  Hypomobility,Pain,Increased fascial restricitons,Decreased strength,Decreased activity tolerance,Difficulty walking,Impaired perceived functional ability,Decreased range of motion,Decreased balance,Decreased mobility,Impaired flexibility,Decreased coordination  Visit Diagnosis: Pain in left hip  Pain in right hip  Muscle weakness (generalized)  Right knee pain, unspecified chronicity  Difficulty in walking, not elsewhere classified     Problem List Patient Active Problem List   Diagnosis Date Noted  . Prediabetes 02/12/2021  . Hypertension 02/03/2021  . H/O blood clots   . S/P laparoscopic cholecystectomy 09/06/2019  . Osteopenia 03/15/2018  . Intolerant of heat 03/15/2018  . Low back pain 03/15/2018  . Menopausal symptom 03/15/2018  . Urinary tract infectious disease 03/15/2018  . Sprain of ankle 06/24/2016  . Avulsion of right ankle 02/24/2016  . Toe pain, left 08/26/2015  . 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 Hallux limitus of left foot 08/26/2015  . Class 1 obesity due to excess calories with body mass index (BMI) of 32.0 to 32.9 in adult 08/31/2012  . Abnormal Pap smear, atypical squamous cells of undetermined sign (ASC-US) 02/24/2012  . Tinea corporis 08/28/2009  . Disorder of bone and cartilage 10/01/2008  . Allergic rhinitis 09/06/2008  . Benign neoplasm of colon 09/06/2008  . Personal history of colonic polyps 02/19/2008    Scot Jun, PT, DPT, OCS, ATC 03/17/21  10:02 AM    [facility] Physical Therapy [address] [place], [place], [address] Phone: [phone]   Fax:  [phone]  Name: Saya Mccoll MRN: [id number] Date of Birth: 09/12/1956

## 2021-03-19 ENCOUNTER — Other Ambulatory Visit: Payer: Self-pay

## 2021-03-19 ENCOUNTER — Ambulatory Visit (INDEPENDENT_AMBULATORY_CARE_PROVIDER_SITE_OTHER): Payer: Medicare Other | Admitting: Rehabilitative and Restorative Service Providers"

## 2021-03-19 ENCOUNTER — Encounter: Payer: Self-pay | Admitting: Rehabilitative and Restorative Service Providers"

## 2021-03-19 DIAGNOSIS — M6281 Muscle weakness (generalized): Secondary | ICD-10-CM | POA: Diagnosis not present

## 2021-03-19 DIAGNOSIS — R262 Difficulty in walking, not elsewhere classified: Secondary | ICD-10-CM

## 2021-03-19 DIAGNOSIS — M25561 Pain in right knee: Secondary | ICD-10-CM

## 2021-03-19 DIAGNOSIS — M25552 Pain in left hip: Secondary | ICD-10-CM

## 2021-03-19 DIAGNOSIS — M25551 Pain in right hip: Secondary | ICD-10-CM

## 2021-03-19 NOTE — Therapy (Signed)
[facility] [address] [place], [place], [address] Phone: [phone]   Fax:  [phone]  Physical Therapy Treatment  Patient Details  Name: Faith Galvan MRN: [id number] Date of Birth: 08/17/1956 Referring Provider (PT): Dr. Blackman   Encounter Date: 03/19/2021   PT End of Session - 03/19/21 0928    Visit Number 12    Number of Visits 20    Date for PT Re-Evaluation 04/06/21    Authorization Type Medicare 10 weeks POC, KX Modifier 15 visits    Authorization - Visit Number 12    Progress Note Due on Visit 20    PT Start Time 0928    PT Stop Time 1010    PT Time Calculation (min) 42 min    Activity Tolerance Patient tolerated treatment well    Behavior During Therapy WFL for tasks assessed/performed           Past Medical History:  Diagnosis Date  . 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 Allergy PCN  . Arthritis   . H/O blood clots    18 months old - blood clots in right leg  . Hx of adenomatous polyp of rectum 02/19/2008  . Hypertension   . Infertility, female   . MVP (mitral valve prolapse)     Past Surgical History:  Procedure Laterality Date  . CHOLECYSTECTOMY N/A 09/06/2019   Procedure: LAPAROSCOPIC CHOLECYSTECTOMY, UMBILICIAL HERNIA REPAIR;  Surgeon: Blackman, Douglas, MD;  Location: [facility] OR;  Service: General;  Laterality: N/A;  . HYSTEROSCOPY    . TONSILECTOMY/ADENOIDECTOMY WITH MYRINGOTOMY      There were no vitals filed for this visit.   Subjective Assessment - 03/19/21 0934    Subjective Pt. stated 3/10 on Rt side today.  Pt. stated yesterday and night was better than before last visit.    Pertinent History Injection Lt hip.    Limitations Walking;Standing;Sitting    Patient Stated Goals Reduce pain, yard work, exercise    Currently in Pain? Yes    Pain Score 3     Pain Location Hip    Pain Orientation Right;Anterior    Pain Descriptors / Indicators Aching    Pain Type Chronic pain    Pain Onset  More than a month ago   Dec 2021 Lt hip, Rt hip comes and goes   Pain Frequency Intermittent    Aggravating Factors  unsure of which activities    Pain Score 0    Pain Onset More than a month ago   acute on chronic                            [facility] Adult PT Treatment/Exercise - 03/19/21 0001      Knee/Hip Exercises: Aerobic   Nustep Lvl 6 10 mins      Knee/Hip Exercises: Supine   Bridges 15 reps;5 reps      Knee/Hip Exercises: Sidelying   Other Sidelying Knee/Hip Exercises Lt sidelying clam shell, reverse clam shell Rt leg 20x      Manual Therapy   Manual therapy comments inferior Rt hip joint glides g3-g4, anterior mobs in prone to Rt hip g3-g4 c ER mobilization c movement                    PT Short Term Goals - 02/19/21 1216      PT SHORT TERM GOAL #1   Title Patient will demonstrate   independent use of home exercise program to maintain progress from in clinic treatments.    Baseline 3/31: inconsistent compliance to date    Time 3    Period Weeks    Status Achieved    Target Date 02/16/21             PT Long Term Goals - 03/12/21 1000      PT LONG TERM GOAL #1   Title Patient will demonstrate/report pain at worst less than or equal to 2/10 to facilitate minimal limitation in daily activity secondary to pain symptoms.    Time 10    Period Weeks    Status On-going    Target Date 04/06/21      PT LONG TERM GOAL #2   Title Patient will demonstrate independent use of home exercise program to facilitate ability to maintain/progress functional gains from skilled physical therapy services.    Time 10    Period Weeks    Status On-going    Target Date 04/06/21      PT LONG TERM GOAL #3   Title Pt. will demonstrate bilateral LE MMT 5/5 throughout s symptoms to facilitate usual standing, walking at PLOF s limitation.    Time 10    Period Weeks    Status Partially Met    Target Date 04/06/21      PT LONG TERM GOAL #4   Title Pt. will  demonstrate bilateral hip AROM WFL s symptoms to facilitate usual activity at PLOF.    Time 10    Period Weeks    Status Partially Met    Target Date 04/06/21      PT LONG TERM GOAL #5   Title Pt. will demonstrate FOTO > or = 59% to indicate reduced disability 2' to condition.    Time 10    Period Weeks    Status On-going    Target Date 04/06/21      PT LONG TERM GOAL #6   Title Pt. will demonstrate bialteral SLS 15 seconds s trendelenburg to facilitate improved stability in ambulation.    Time 10    Period Weeks    Status Partially Met    Target Date 04/06/21                 Plan - 03/19/21 1010    Clinical Impression Statement Passive flexion and ER improved today compared to last visit with benefits noted from manual intervention in short term for sure at this time.  Continued emphasis on mobility intervention and overall posterior/lateral hip strengthening in HEP and clinic intervention.    Personal Factors and Comorbidities Comorbidity 1    Comorbidities HTN    Examination-Activity Limitations Sit;Squat;Stairs;Stand;Locomotion Level    Examination-Participation Restrictions Community Activity;Yard Work    Stability/Clinical Decision Making Stable/Uncomplicated    Rehab Potential Good    PT Frequency 2x / week    PT Duration Other (comment)   10 weeks   PT Treatment/Interventions ADLs/Self Care Home Management;Cryotherapy;Electrical Stimulation;Iontophoresis 74m/ml Dexamethasone;Moist Heat;Traction;Balance training;Therapeutic exercise;Therapeutic activities;Functional mobility training;Stair training;Gait training;Patient/family education;DME Instruction;Ultrasound;Neuromuscular re-education;Manual techniques;Taping;Passive range of motion;Dry needling;Spinal Manipulations;Joint Manipulations    PT Next Visit Plan ER, flexion Rt hip mobilizations for movement improvement, continue posterior/lateral hip strength    PT Home Exercise Plan [phone]   Consulted and Agree  with Plan of Care Patient           Patient will benefit from skilled therapeutic intervention in order to improve the following deficits and impairments:  Hypomobility,Pain,Increased fascial restricitons,Decreased strength,Decreased activity tolerance,Difficulty walking,Impaired perceived functional ability,Decreased range of motion,Decreased balance,Decreased mobility,Impaired flexibility,Decreased coordination  Visit Diagnosis: Pain in left hip  Pain in right hip  Muscle weakness (generalized)  Right knee pain, unspecified chronicity  Difficulty in walking, not elsewhere classified     Problem List Patient Active Problem List   Diagnosis Date Noted  . Prediabetes 02/12/2021  . Hypertension 02/03/2021  . H/O blood clots   . S/P laparoscopic cholecystectomy 09/06/2019  . Osteopenia 03/15/2018  . Intolerant of heat 03/15/2018  . Low back pain 03/15/2018  . Menopausal symptom 03/15/2018  . Urinary tract infectious disease 03/15/2018  . Sprain of ankle 06/24/2016  . Avulsion of right ankle 02/24/2016  . Toe pain, left 08/26/2015  . 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 Hallux limitus of left foot 08/26/2015  . Class 1 obesity due to excess calories with body mass index (BMI) of 32.0 to 32.9 in adult 08/31/2012  . Abnormal Pap smear, atypical squamous cells of undetermined sign (ASC-US) 02/24/2012  . Tinea corporis 08/28/2009  . Disorder of bone and cartilage 10/01/2008  . Allergic rhinitis 09/06/2008  . Benign neoplasm of colon 09/06/2008  . Personal history of colonic polyps 02/19/2008    Scot Jun, PT, DPT, OCS, ATC 03/19/21  10:12 AM    [facility] [address] [place], [place], [address] Phone: [phone]   Fax:  [phone]  Name: Stavroula Rohde MRN: [id number] Date of Birth: Aug 11, 1956

## 2021-03-24 NOTE — Progress Notes (Signed)
Chief Complaint:   OBESITY Faith Galvan is here to discuss her progress with her obesity treatment plan along with follow-up of her obesity related diagnoses.   Today's visit was #: 3 Starting weight: 186 lbs Starting date: 02/11/2021 Today's weight: 186 lbs Today's date: 03/17/2021 Total lbs lost to date: 0 Body mass index is 32.95 kg/m.   Interim History:  Faith Galvan has been having chicken, lean hamburger, and fish.  She says that salads cause diarrhea due to history of cholecystectomy.  She endorses poor sleep.  She wakes up sweating.    Plan:  Contact PCP to document change of blood pressure medication.  Consider echo with history of MVP, new night sweats.  Faith Galvan provided the following food recall today:  Breakfast:  3/4 yogurt. Lunch:  Roast beef sandwich. Dinner:  2-3 chicken tenders, beans, field peas.  Current Meal Plan: the Category 1 Plan and keeping a food journal and adhering to recommended goals of 1000 calories and 70-80 grams of protein for 90% of the time.  Current Exercise Plan: Aerobics and yard work for 45 minutes 2-3 times per week.  Assessment/Plan:   1. Prediabetes Not at goal. Goal is HgbA1c < 5.7.     Plan:  She will continue to focus on protein-rich, low simple carbohydrate foods. We reviewed the importance of hydration, regular exercise for stress reduction, and restorative sleep.   Lab Results  Component Value Date   HGBA1C 5.8 (H) 02/11/2021   Lab Results  Component Value Date   INSULIN 12.0 02/11/2021   2. Essential hypertension Not at goal. Medications: valsartan-HCTZ 1/2 tablet daily.   Plan: Avoid buying foods that are: processed, frozen, or prepackaged to avoid excess salt. We will watch for signs of hypotension as she continues lifestyle modifications. We will continue to monitor closely alongside her PCP and/or Specialist.    BP Readings from Last 3 Encounters:  03/17/21 133/77  02/25/21 (!) 142/86  02/11/21 (!) 145/87   Lab  Results  Component Value Date   CREATININE 0.77 09/03/2019   3. Multiple joint pain Faith Galvan is currently in PT and is also followed by Orthopedics.  Plan:  Will follow because mobility and pain control are important for weight management.  4. Night sweats Advised her to contact Cardiology regarding her new night sweat with her history of MVP and change of blood pressure medication.   5. Obesity, current BMI 32.9  Course: Faith Galvan is currently in the action stage of change. As such, her goal is to continue with weight loss efforts.   Nutrition goals: She has agreed to keeping a food journal and adhering to recommended goals of 1000 calories and 70-80 grams of protein.   Exercise goals: For substantial health benefits, adults should do at least 150 minutes (2 hours and 30 minutes) a week of moderate-intensity, or 75 minutes (1 hour and 15 minutes) a week of vigorous-intensity aerobic physical activity, or an equivalent combination of moderate- and vigorous-intensity aerobic activity. Aerobic activity should be performed in episodes of at least 10 minutes, and preferably, it should be spread throughout the week.  Behavioral modification strategies: increasing lean protein intake, decreasing simple carbohydrates, increasing vegetables and increasing water intake.  Faith Galvan has agreed to follow-up with our clinic in 3 weeks. She was informed of the importance of frequent follow-up visits to maximize her success with intensive lifestyle modifications for her multiple health conditions.   Objective:   Blood pressure 133/77, pulse 65, temperature 98.1 F (36.7 C), temperature  source Oral, height 5\' 3"  (1.6 m), weight 186 lb (84.4 kg), SpO2 98 %. Body mass index is 32.95 kg/m.  General: Cooperative, alert, well developed, in no acute distress. HEENT: Conjunctivae and lids unremarkable. Cardiovascular: Regular rhythm.  Lungs: Normal work of breathing. Neurologic: No focal deficits.   Lab  Results  Component Value Date   CREATININE 0.77 09/03/2019   BUN 18 09/03/2019   NA 138 09/03/2019   K 4.4 09/03/2019   CL 107 09/03/2019   CO2 21 (L) 09/03/2019   Lab Results  Component Value Date   ALT 25 09/22/2018   AST 15 09/22/2018   ALKPHOS 89 09/22/2018   BILITOT 0.3 09/22/2018   Lab Results  Component Value Date   HGBA1C 5.8 (H) 02/11/2021   Lab Results  Component Value Date   INSULIN 12.0 02/11/2021   Lab Results  Component Value Date   TSH 1.430 09/22/2018   Lab Results  Component Value Date   CHOL 247 (H) 09/22/2018   HDL 80 09/22/2018   LDLCALC 134 (H) 09/22/2018   TRIG 163 (H) 09/22/2018   CHOLHDL 3.1 09/22/2018   Lab Results  Component Value Date   WBC 6.1 09/03/2019   HGB 13.4 09/03/2019   HCT 40.4 09/03/2019   MCV 103.6 (H) 09/03/2019   PLT 258 09/03/2019   Obesity Behavioral Intervention:   Approximately 15 minutes were spent on the discussion below.  ASK: We discussed the diagnosis of obesity with Faith Galvan today and Faith Galvan agreed to give [place] permission to discuss obesity behavioral modification therapy today.  ASSESS: Faith Galvan has the diagnosis of obesity and her BMI today is 32.9. Faith Galvan is in the action stage of change.   ADVISE: Faith Galvan was educated on the multiple health risks of obesity as well as the benefit of weight loss to improve her health. She was advised of the need for long term treatment and the importance of lifestyle modifications to improve her current health and to decrease her risk of future health problems.  AGREE: Multiple dietary modification options and treatment options were discussed and Faith Galvan agreed to follow the recommendations documented in the above note.  ARRANGE: Faith Galvan was educated on the importance of frequent visits to treat obesity as outlined per CMS and USPSTF guidelines and agreed to schedule her next follow up appointment today.  Attestation Statements:   Reviewed by clinician on day of  visit: allergies, medications, problem list, medical history, surgical history, family history, social history, and previous encounter notes.  I, Water quality scientist, CMA, am acting as transcriptionist for Briscoe Deutscher, DO  I have reviewed the above documentation for accuracy and completeness, and I agree with the above. Briscoe Deutscher, DO

## 2021-03-30 ENCOUNTER — Ambulatory Visit (INDEPENDENT_AMBULATORY_CARE_PROVIDER_SITE_OTHER): Payer: Medicare Other | Admitting: Bariatrics

## 2021-03-30 ENCOUNTER — Encounter (INDEPENDENT_AMBULATORY_CARE_PROVIDER_SITE_OTHER): Payer: Self-pay | Admitting: Bariatrics

## 2021-03-30 ENCOUNTER — Other Ambulatory Visit: Payer: Self-pay

## 2021-03-30 ENCOUNTER — Telehealth: Payer: Self-pay

## 2021-03-30 VITALS — BP 146/89 | HR 70 | Temp 98.0°F | Ht 63.0 in | Wt 187.0 lb

## 2021-03-30 DIAGNOSIS — Z6832 Body mass index (BMI) 32.0-32.9, adult: Secondary | ICD-10-CM | POA: Diagnosis not present

## 2021-03-30 DIAGNOSIS — R7303 Prediabetes: Secondary | ICD-10-CM | POA: Diagnosis not present

## 2021-03-30 DIAGNOSIS — E669 Obesity, unspecified: Secondary | ICD-10-CM

## 2021-03-30 DIAGNOSIS — I10 Essential (primary) hypertension: Secondary | ICD-10-CM

## 2021-03-30 MED ORDER — METFORMIN HCL 500 MG PO TABS: 500.0000 mg | ORAL_TABLET | Freq: Every day | ORAL | 0 refills | Status: DC

## 2021-03-30 NOTE — Telephone Encounter (Signed)
Progress notes in file sent from [facility] at [facility], Phone: [phone]. Referral have been sent to scheduling.

## 2021-03-31 ENCOUNTER — Ambulatory Visit (INDEPENDENT_AMBULATORY_CARE_PROVIDER_SITE_OTHER): Payer: Medicare Other | Admitting: Orthopaedic Surgery

## 2021-03-31 ENCOUNTER — Encounter (INDEPENDENT_AMBULATORY_CARE_PROVIDER_SITE_OTHER): Payer: Self-pay | Admitting: Bariatrics

## 2021-03-31 ENCOUNTER — Ambulatory Visit (INDEPENDENT_AMBULATORY_CARE_PROVIDER_SITE_OTHER): Payer: Medicare Other | Admitting: Rehabilitative and Restorative Service Providers"

## 2021-03-31 ENCOUNTER — Other Ambulatory Visit: Payer: Self-pay

## 2021-03-31 ENCOUNTER — Encounter: Payer: Self-pay | Admitting: Rehabilitative and Restorative Service Providers"

## 2021-03-31 ENCOUNTER — Encounter: Payer: Self-pay | Admitting: Orthopaedic Surgery

## 2021-03-31 DIAGNOSIS — M25561 Pain in right knee: Secondary | ICD-10-CM | POA: Diagnosis not present

## 2021-03-31 DIAGNOSIS — M6281 Muscle weakness (generalized): Secondary | ICD-10-CM

## 2021-03-31 DIAGNOSIS — R262 Difficulty in walking, not elsewhere classified: Secondary | ICD-10-CM

## 2021-03-31 DIAGNOSIS — M25552 Pain in left hip: Secondary | ICD-10-CM

## 2021-03-31 DIAGNOSIS — M25551 Pain in right hip: Secondary | ICD-10-CM

## 2021-03-31 DIAGNOSIS — M7062 Trochanteric bursitis, left hip: Secondary | ICD-10-CM | POA: Diagnosis not present

## 2021-03-31 NOTE — Progress Notes (Signed)
The patient comes in today for continued follow-up for both her hips.  We x-rayed them in January of last year which showed normal-appearing hips.  She been dealing with significant left hip trochanteric bursitis.  She says physical therapy is helped quite a bit with the left hip and with dry needling.  That is getting much better and she does have another physical therapy session later this morning.  Her right hip though has been hurting in the groin.  She says it really flared up on vacation recently.  She does plan on moving coming up and wants to feel better with the right hip.  Both hips move smoothly and fluidly with no block to rotation at all.  Her right hip does hurt some of the groin.  The left hip does not bother her.  I did review the x-rays from January 2021 of both hips and they were normal-appearing in terms of joint space and no worrisome features.  I would like to send her to Dr. Ernestina Patches for a steroid injection in her right hip under fluoroscopy.  She agrees with this treatment plan.  I will see her back myself in about 6 weeks and by then she will have the injection and we can see if that is helped.  All questions and concerns were answered and addressed.

## 2021-03-31 NOTE — Therapy (Addendum)
[facility] [address] [place], [place], [address] Phone: [phone]   Fax:  [phone]  Physical Therapy Treatment/Discharge  Patient Details  Name: Faith Galvan MRN: [id number] Date of Birth: Nov 07, 1956 Referring Provider (PT): Dr. 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   Subjective Assessment - 03/31/21 1009     Subjective Pt. reported feeling Lt side continued to be better overall with times of noticing some mild complaints.  Insidious worsening for Rt hip at times.  Home program seemes to be helpful.  Pt. saw MD and will be referred for injection for Rt hip.    Pertinent History Injection Lt hip.    Limitations Walking;Standing;Sitting    Patient Stated Goals Reduce pain, yard work, exercise    Currently in Pain? Yes    Pain  Score 3     Pain Location Hip    Pain Orientation Right;Anterior    Pain Descriptors / Indicators Aching;Sore    Pain Type Chronic pain    Pain Onset More than a month ago   Dec 2021 Lt hip, Rt hip comes and goes   Pain Frequency Intermittent    Aggravating Factors  insidious worsening, sometimes increased activity    Pain Relieving Factors stretching and manual interventions.    Pain Onset More than a month ago   acute on chronic               [facility] PT Assessment - 03/31/21 0001       Assessment   Medical Diagnosis Bilateral hip pain, Rt knee pain    Referring Provider (PT) Dr. Ninfa Linden    Onset Date/Surgical Date 10/15/20    Hand Dominance Right      AROM   Right Hip External Rotation  35   in sitting                          [facility] Adult PT Treatment/Exercise - 03/31/21 0001       Knee/Hip Exercises: Stretches   Other Knee/Hip Stretches supine single knee  to chest 15 sec x 5 bilateral, supine figure 4 ER pulling to and pushing away 15 sec x 5 bilateral      Knee/Hip Exercises: Aerobic   Nustep Lvl 6 10 mins      Manual Therapy   Manual therapy comments inferior Rt hip joint glides g3-g4, anterior mobs in prone to Rt hip g3-g4 c ER mobilization c movement                      PT Short Term Goals - 02/19/21 1216       PT SHORT TERM GOAL #1   Title Patient will demonstrate independent use of home exercise program to maintain progress from in clinic treatments.    Baseline 3/31: inconsistent compliance to date    Time 3    Period Weeks    Status Achieved    Target Date 02/16/21               PT Long Term Goals - 03/12/21 1000       PT LONG TERM GOAL #1   Title Patient will demonstrate/report pain at worst less than or equal to 2/10 to facilitate minimal limitation in daily activity secondary to pain symptoms.    Time 10    Period Weeks    Status On-going    Target Date 04/06/21      PT LONG TERM GOAL #2   Title  Patient will demonstrate independent use of home exercise program to facilitate ability to maintain/progress functional gains from skilled physical therapy services.    Time 10    Period Weeks    Status On-going    Target Date 04/06/21      PT LONG TERM GOAL #3   Title Pt. will demonstrate bilateral LE MMT 5/5 throughout s symptoms to facilitate usual standing, walking at PLOF s limitation.    Time 10    Period Weeks    Status Partially Met    Target Date 04/06/21      PT LONG TERM GOAL #4   Title Pt. will demonstrate bilateral hip AROM WFL s symptoms to facilitate usual activity at PLOF.    Time 10    Period Weeks    Status Partially Met    Target Date 04/06/21      PT LONG TERM GOAL #5   Title Pt. will demonstrate FOTO > or = 59% to indicate reduced disability 2' to condition.    Time 10    Period Weeks    Status On-going    Target Date 04/06/21      PT LONG TERM GOAL #6   Title Pt. will demonstrate bialteral SLS 15 seconds s trendelenburg to facilitate improved stability in ambulation.    Time 10    Period Weeks    Status Partially Met    Target Date 04/06/21                   Plan - 03/31/21 1031     Clinical Impression Statement Presentation overall similar c Rt hip joint pain noted at end range, primarily flexion and ER and in functional tasks such as putting on socks/shoes.  Pt. has been referred for injection to Rt hip.  Plan to continue to use of HEP and place skilled PT on hold for analysis of injection benefits.    Personal Factors and Comorbidities Comorbidity 1    Comorbidities HTN    Examination-Activity Limitations Sit;Squat;Stairs;Stand;Locomotion Level  Examination-Participation Restrictions Community Activity;Yard Work    Stability/Clinical Decision Making Stable/Uncomplicated    Rehab Potential Good    PT Frequency 2x / week    PT Duration Other (comment)   10 weeks   PT Treatment/Interventions ADLs/Self Care Home  Management;Cryotherapy;Electrical Stimulation;Iontophoresis 21m/ml Dexamethasone;Moist Heat;Traction;Balance training;Therapeutic exercise;Therapeutic activities;Functional mobility training;Stair training;Gait training;Patient/family education;DME Instruction;Ultrasound;Neuromuscular re-education;Manual techniques;Taping;Passive range of motion;Dry needling;Spinal Manipulations;Joint Manipulations    PT Next Visit Plan Injection to be performed.  PT on hold.    PT Home Exercise Plan [phone]   Consulted and Agree with Plan of Care Patient             Patient will benefit from skilled therapeutic intervention in order to improve the following deficits and impairments:  Hypomobility,Pain,Increased fascial restricitons,Decreased strength,Decreased activity tolerance,Difficulty walking,Impaired perceived functional ability,Decreased range of motion,Decreased balance,Decreased mobility,Impaired flexibility,Decreased coordination  Visit Diagnosis: Pain in left hip  Pain in right hip  Muscle weakness (generalized)  Right knee pain, unspecified chronicity  Difficulty in walking, not elsewhere classified     Problem List Patient Active Problem List   Diagnosis Date Noted   Prediabetes 02/12/2021   Hypertension 02/03/2021   H/O blood clots    S/P laparoscopic cholecystectomy 09/06/2019   Osteopenia 03/15/2018   Intolerant of heat 03/15/2018   Low back pain 03/15/2018   Menopausal symptom 03/15/2018   Urinary tract infectious disease 03/15/2018   Sprain of ankle 06/24/2016   Avulsion of right ankle 02/24/2016   Toe pain, left 08/26/2015   Loss of transverse plantar arch of left foot 08/26/2015   Hallux limitus of left foot 08/26/2015   Class 1 obesity due to excess calories with body mass index (BMI) of 32.0 to 32.9 in adult 08/31/2012   Abnormal Pap smear, atypical squamous cells of undetermined sign (ASC-US) 02/24/2012   Tinea corporis 08/28/2009   Disorder of bone and  cartilage 10/01/2008   Allergic rhinitis 09/06/2008   Benign neoplasm of colon 09/06/2008   Personal history of colonic polyps 02/19/2008    MScot Jun PT, DPT, OCS, ATC 03/31/21  10:45 AM  PHYSICAL THERAPY DISCHARGE SUMMARY  Visits from Start of Care: 13  Current functional level related to goals / functional outcomes: See note   Remaining deficits: See note   Education / Equipment: HEP   Patient agrees to discharge. Patient goals were partially met. Patient is being discharged due to  discharged to HEP for Lt hip improvement, Rt hip injection to be performed. MScot Jun PT, DPT, OCS, ATC 04/30/21  11:57 AM    [facility] [address] [address]Phone: [phone]  Fax:  [phone] Name: DEstefanie CornforthMRN: [id number] of Birth: 328-Nov-1957

## 2021-03-31 NOTE — Progress Notes (Signed)
Chief Complaint:   OBESITY Faith Galvan is here to discuss her progress with her obesity treatment plan along with follow-up of her obesity related diagnoses. Faith Galvan is on the Category 1 Plan and states she is following her eating plan approximately 85% of the time. Faith Galvan states she is doing aerobics and yard work 45-60 minutes 2-3 times per week.  Today's visit was #: 4 Starting weight: 186 lbs Starting date: 02/11/2021 Today's weight: 187 lbs Today's date: 03/30/2021 Total lbs lost to date: 0 Total lbs lost since last in-office visit: 0  Interim History: Faith Galvan is up 1 lb since her last visit. She was on vacation last week. Her muscle mass is going up.  Subjective:   1. Essential hypertension Faith Galvan's BP is reasonably well controlled. She is taking Diovan/HCT.  2. Pre-diabetes Faith Galvan has a diagnosis of prediabetes based on her elevated HgA1c and was informed this puts her at greater risk of developing diabetes. She continues to work on diet and exercise to decrease her risk of diabetes. She denies nausea or hypoglycemia. She is not taking any medications.  Lab Results  Component Value Date   HGBA1C 5.8 (H) 02/11/2021   Lab Results  Component Value Date   INSULIN 12.0 02/11/2021    Assessment/Plan:   1. Essential hypertension Faith Galvan is working on healthy weight loss and exercise to improve blood pressure control. We will watch for signs of hypotension as she continues her lifestyle modifications. Continue current treatment plan.  2. Pre-diabetes Faith Galvan will continue to work on weight loss, exercise, and decreasing simple carbohydrates to help decrease the risk of diabetes.  1. Continue to increase activities 2. Decrease carbs 3. Do not take Metformin without eating first. - metFORMIN (GLUCOPHAGE) 500 MG tablet; Take 1 tablet (500 mg total) by mouth daily with lunch.  Dispense: 30 tablet; Refill: 0  3. Obesity, current BMI 33  Faith Galvan is currently in the action  stage of change. As such, her goal is to continue with weight loss efforts. She has agreed to the Category 1 Plan.   Meal plan Intentional eating No carbohydrates  Exercise goals: As is  Behavioral modification strategies: increasing lean protein intake, decreasing simple carbohydrates, increasing vegetables, increasing water intake, decreasing eating out, no skipping meals, meal planning and cooking strategies, keeping healthy foods in the home and planning for success.  Faith Galvan has agreed to follow-up with our clinic in 2 weeks. She was informed of the importance of frequent follow-up visits to maximize her success with intensive lifestyle modifications for her multiple health conditions.   Objective:   Blood pressure (!) 146/89, pulse 70, temperature 98 F (36.7 C), height 5\' 3"  (1.6 m), weight 187 lb (84.8 kg), SpO2 96 %. Body mass index is 33.13 kg/m.  General: Cooperative, alert, well developed, in no acute distress. HEENT: Conjunctivae and lids unremarkable. Cardiovascular: Regular rhythm.  Lungs: Normal work of breathing. Neurologic: No focal deficits.   Lab Results  Component Value Date   CREATININE 0.77 09/03/2019   BUN 18 09/03/2019   NA 138 09/03/2019   K 4.4 09/03/2019   CL 107 09/03/2019   CO2 21 (L) 09/03/2019   Lab Results  Component Value Date   ALT 25 09/22/2018   AST 15 09/22/2018   ALKPHOS 89 09/22/2018   BILITOT 0.3 09/22/2018   Lab Results  Component Value Date   HGBA1C 5.8 (H) 02/11/2021   Lab Results  Component Value Date   INSULIN 12.0 02/11/2021   Lab Results  Component Value Date   TSH 1.430 09/22/2018   Lab Results  Component Value Date   CHOL 247 (H) 09/22/2018   HDL 80 09/22/2018   LDLCALC 134 (H) 09/22/2018   TRIG 163 (H) 09/22/2018   CHOLHDL 3.1 09/22/2018   Lab Results  Component Value Date   WBC 6.1 09/03/2019   HGB 13.4 09/03/2019   HCT 40.4 09/03/2019   MCV 103.6 (H) 09/03/2019   PLT 258 09/03/2019   No results  found for: IRON, TIBC, FERRITIN  Obesity Behavioral Intervention:   Approximately 15 minutes were spent on the discussion below.  ASK: We discussed the diagnosis of obesity with Faith Galvan today and Faith Galvan agreed to give [place] permission to discuss obesity behavioral modification therapy today.  ASSESS: Faith Galvan has the diagnosis of obesity and her BMI today is 33.1. Faith Galvan is in the action stage of change.   ADVISE: Faith Galvan was educated on the multiple health risks of obesity as well as the benefit of weight loss to improve her health. She was advised of the need for long term treatment and the importance of lifestyle modifications to improve her current health and to decrease her risk of future health problems.  AGREE: Multiple dietary modification options and treatment options were discussed and Faith Galvan agreed to follow the recommendations documented in the above note.  ARRANGE: Faith Galvan was educated on the importance of frequent visits to treat obesity as outlined per CMS and USPSTF guidelines and agreed to schedule her next follow up appointment today.  Attestation Statements:   Reviewed by clinician on day of visit: allergies, medications, problem list, medical history, surgical history, family history, social history, and previous encounter notes.  Coral Ceo, CMA, am acting as Location manager for [employer], DO.  I have reviewed the above documentation for accuracy and completeness, and I agree with the above. Jearld Lesch, DO

## 2021-04-01 ENCOUNTER — Encounter: Payer: Self-pay | Admitting: Orthopaedic Surgery

## 2021-04-02 ENCOUNTER — Encounter: Payer: Medicare Other | Admitting: Rehabilitative and Restorative Service Providers"

## 2021-04-09 ENCOUNTER — Encounter: Payer: Self-pay | Admitting: Physical Medicine and Rehabilitation

## 2021-04-09 ENCOUNTER — Other Ambulatory Visit: Payer: Self-pay

## 2021-04-09 ENCOUNTER — Ambulatory Visit (INDEPENDENT_AMBULATORY_CARE_PROVIDER_SITE_OTHER): Payer: Medicare Other | Admitting: Physical Medicine and Rehabilitation

## 2021-04-09 ENCOUNTER — Ambulatory Visit: Payer: Self-pay

## 2021-04-09 DIAGNOSIS — M25551 Pain in right hip: Secondary | ICD-10-CM | POA: Diagnosis not present

## 2021-04-09 NOTE — Progress Notes (Signed)
   Faith Galvan - 65 y.o. female MRN [id number]  Date of birth: 1956/04/20  Office Visit Note: Visit Date: 04/09/2021 PCP: Leia Alf, PA-C Referred by: Leia Alf, PA-C  Subjective: Chief Complaint  Patient presents with  . Right Hip - Pain   HPI:  Faith Galvan is a 65 y.o. female who comes in today at the request of Dr. Jean Rosenthal for planned Right anesthetic hip arthrogram with fluoroscopic guidance.  The patient has failed conservative care including home exercise, medications, time and activity modification.  This injection will be diagnostic and hopefully therapeutic.  Please see requesting physician notes for further details and justification.   Review of Systems  Musculoskeletal: Positive for back pain.  Neurological: Positive for tingling.  All other systems reviewed and are negative.  Otherwise per HPI.  Assessment & Plan: Visit Diagnoses:    ICD-10-CM   1. Pain in right hip  M25.551 Large Joint Inj: R hip joint    XR C-ARM NO REPORT    Plan: No additional findings.   Meds & Orders: No orders of the defined types were placed in this encounter.   Orders Placed This Encounter  Procedures  . Large Joint Inj: R hip joint  . XR C-ARM NO REPORT    Follow-up: Return for visit to requesting physician as needed.   Procedures: Large Joint Inj: R hip joint on 04/09/2021 8:54 AM Indications: diagnostic evaluation and pain Details: 22 G 3.5 in needle, fluoroscopy-guided anterior approach  Arthrogram: No  Medications: 4 mL bupivacaine 0.25 %; 60 mg triamcinolone acetonide 40 MG/ML Outcome: tolerated well, no immediate complications  There was excellent flow of contrast producing a partial arthrogram of the hip. The patient did have relief of symptoms during the anesthetic phase of the injection. Procedure, treatment alternatives, risks and benefits explained, specific risks discussed. Consent was given by the patient. Immediately  prior to procedure a time out was called to verify the correct patient, procedure, equipment, support staff and site/side marked as required. Patient was prepped and draped in the usual sterile fashion.          Clinical History: No specialty comments available.     Objective:  VS:  HT:    WT:   BMI:     BP:   HR: bpm  TEMP: ( )  RESP:  Physical Exam   Imaging: XR C-ARM NO REPORT  Result Date: 04/09/2021 Please see Notes tab for imaging impression.

## 2021-04-09 NOTE — Progress Notes (Signed)
Pt state right hip and groin pain. Pt state walking and lifting makes the pain worse. Pt state she uses heating and ice and she takes over the counter pain meds.  Numeric Pain Rating Scale and Functional Assessment Average Pain 4   In the last MONTH (on 0-10 scale) has pain interfered with the following?  1. General activity like being  able to carry out your everyday physical activities such as walking, climbing stairs, carrying groceries, or moving a chair?  Rating(10)   -BT, -Dye Allergies.

## 2021-04-09 NOTE — Progress Notes (Deleted)
Office Visit Note  Patient: Faith Galvan           Date of Birth: [birth year]           MRN: [id number] Visit Date: 04/09/2021              Requested by: Leia Alf, PA-C [address],  [place] [address] PCP: Leia Alf, [place]   Assessment & Plan: Visit Diagnoses: No diagnosis found.  Follow-Up Instructions: No follow-ups on file.  Orders:  No orders of the defined types were placed in this encounter.   No orders of the defined types were placed in this encounter.     Procedures:     Other Procedures: No procedures performed   Clinical Data: No additional findings.   Subjective: No chief complaint on file.   HPI  Review of Systems   Objective: Vital Signs: There were no vitals taken for this visit.   Physical Exam  Ortho Exam  Specialty Comments:  No specialty comments available. Imaging: No results found.   PMFS History: Patient Active Problem List   Diagnosis Date Noted  . Prediabetes 02/12/2021  . Hypertension 02/03/2021  . H/O blood clots   . S/P laparoscopic cholecystectomy 09/06/2019  . Osteopenia 03/15/2018  . Intolerant of heat 03/15/2018  . Low back pain 03/15/2018  . Menopausal symptom 03/15/2018  . Urinary tract infectious disease 03/15/2018  . Sprain of ankle 06/24/2016  . Avulsion of right ankle 02/24/2016  . Toe pain, left 08/26/2015  . 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 Hallux limitus of left foot 08/26/2015  . Class 1 obesity due to excess calories with body mass index (BMI) of 32.0 to 32.9 in adult 08/31/2012  . Abnormal Pap smear, atypical squamous cells of undetermined sign (ASC-US) 02/24/2012  . Tinea corporis 08/28/2009  . Disorder of bone and cartilage 10/01/2008  . Allergic rhinitis 09/06/2008  . Benign neoplasm of colon 09/06/2008  . Personal history of colonic polyps 02/19/2008   Past Medical History:  Diagnosis Date  . 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 Allergy PCN  . Arthritis   . H/O blood clots    18 months old - blood clots in right leg  . Hx of adenomatous polyp of rectum 02/19/2008  . Hypertension   . Infertility, female   . MVP (mitral valve prolapse)     Family History  Problem Relation Age of Onset  . Diabetes Mother   . Hypertension Mother   . High Cholesterol Mother   . Depression Mother   . Anxiety disorder Mother   . Obesity Mother   . Diabetes Father   . Hypertension Father   . High Cholesterol Father   . Depression Father   . Anxiety disorder Father   . Alcohol abuse Father   . Obesity Father   . Cancer Paternal Aunt        mouth cancer  . Cancer Paternal Uncle        mouth cancer  . Cancer Maternal Grandmother        mouth cancer  . Heart disease Paternal Grandfather   . Bladder Cancer Paternal Aunt 2          . Colon cancer Cousin 18  . Colon polyps Neg Hx   . Esophageal cancer Neg Hx   . Stomach cancer Neg Hx   . Rectal cancer Neg  Hx   . Pancreatic cancer Neg Hx   . Prostate cancer Neg Hx    Past Surgical History:  Procedure Laterality Date  . CHOLECYSTECTOMY N/A 09/06/2019   Procedure: LAPAROSCOPIC CHOLECYSTECTOMY, UMBILICIAL HERNIA REPAIR;  Surgeon: Coralie Keens, MD;  Location: [facility];  Service: General;  Laterality: N/A;  . HYSTEROSCOPY    . TONSILECTOMY/ADENOIDECTOMY WITH MYRINGOTOMY     Social History   Occupational History  . Occupation: retired  Tobacco Use  . Smoking status: Former Smoker    Types: Cigarettes    Quit date: 08/22/1994    Years since quitting: 26.6  . Smokeless tobacco: Never Used  Vaping Use  . Vaping Use: Never used  Substance and Sexual Activity  . Alcohol use: Yes    Alcohol/week: 5.0 - 7.0 standard drinks    Types: 5 - 7 Standard drinks or equivalent per week  . Drug use: No  . Sexual activity: Not Currently    Birth control/protection: Post-menopausal

## 2021-04-10 MED ORDER — BUPIVACAINE HCL 0.25 % IJ SOLN
4.0000 mL | INTRAMUSCULAR | Status: AC | PRN
  Administered 2021-04-09: 4 mL via INTRA_ARTICULAR

## 2021-04-10 MED ORDER — TRIAMCINOLONE ACETONIDE 40 MG/ML IJ SUSP
60.0000 mg | INTRAMUSCULAR | Status: AC | PRN
  Administered 2021-04-09: 60 mg via INTRA_ARTICULAR

## 2021-04-16 ENCOUNTER — Other Ambulatory Visit: Payer: Self-pay

## 2021-04-16 ENCOUNTER — Ambulatory Visit (INDEPENDENT_AMBULATORY_CARE_PROVIDER_SITE_OTHER): Payer: Medicare Other | Admitting: Bariatrics

## 2021-04-16 ENCOUNTER — Encounter (INDEPENDENT_AMBULATORY_CARE_PROVIDER_SITE_OTHER): Payer: Self-pay | Admitting: Bariatrics

## 2021-04-16 VITALS — BP 119/78 | HR 77 | Temp 98.0°F | Ht 63.0 in | Wt 182.0 lb

## 2021-04-16 DIAGNOSIS — E669 Obesity, unspecified: Secondary | ICD-10-CM

## 2021-04-16 DIAGNOSIS — Z6832 Body mass index (BMI) 32.0-32.9, adult: Secondary | ICD-10-CM | POA: Diagnosis not present

## 2021-04-16 DIAGNOSIS — I10 Essential (primary) hypertension: Secondary | ICD-10-CM | POA: Diagnosis not present

## 2021-04-16 DIAGNOSIS — R7303 Prediabetes: Secondary | ICD-10-CM | POA: Diagnosis not present

## 2021-04-16 MED ORDER — METFORMIN HCL 500 MG PO TABS: 500.0000 mg | ORAL_TABLET | Freq: Every day | ORAL | 0 refills | Status: DC

## 2021-04-21 NOTE — Progress Notes (Signed)
Chief Complaint:   OBESITY Faith Galvan is here to discuss her progress with her obesity treatment plan along with follow-up of her obesity related diagnoses. Faith Galvan is on the Category 1 Plan and states she is following her eating plan approximately 90% of the time. Faith Galvan states she is doing yard work 2 times per week.  Today's visit was #: 5 Starting weight: 186 lbs Starting date: 02/11/2021 Today's weight: 182 lbs Today's date: 04/16/2021 Total lbs lost to date: 4 lbs Total lbs lost since last in-office visit: 5 lbs  Interim History: Faith Galvan is down 5 pounds since her last visit.  Subjective:   1. Prediabetes Faith Galvan has a diagnosis of prediabetes based on her elevated HgA1c and was informed this puts her at greater risk of developing diabetes. She continues to work on diet and exercise to decrease her risk of diabetes. She denies nausea or hypoglycemia.  Taking metformin and minimal side effects.  Initially, she had some stomach discomfort.  Lab Results  Component Value Date   HGBA1C 5.8 (H) 02/11/2021   Lab Results  Component Value Date   INSULIN 12.0 02/11/2021   2. Essential hypertension Blood pressure is controlled.  Review: taking medications as instructed, no medication side effects noted, no chest pain on exertion, no dyspnea on exertion, no swelling of ankles.   BP Readings from Last 3 Encounters:  04/16/21 119/78  03/30/21 (!) 146/89  03/17/21 133/77   Assessment/Plan:   1. Prediabetes Faith Galvan will continue to work on weight loss, exercise, and decreasing simple carbohydrates to help decrease the risk of diabetes.  Continue metformin.  - Refill metFORMIN (GLUCOPHAGE) 500 MG tablet; Take 1 tablet (500 mg total) by mouth daily with lunch.  Dispense: 30 tablet; Refill: 0  2. Essential hypertension Faith Galvan is working on healthy weight loss and exercise to improve blood pressure control. We will watch for signs of hypotension as she continues her lifestyle  modifications.  Continue medications.  3. Obesity with current BMI of 32.4  Faith Galvan is currently in the action stage of change. As such, her goal is to continue with weight loss efforts. She has agreed to the Category 1 Plan.   She will work on meal planning and will adhere closely to the plan.  Exercise goals: As is.  Behavioral modification strategies: increasing lean protein intake, decreasing simple carbohydrates, increasing vegetables, increasing water intake, decreasing eating out, no skipping meals, meal planning and cooking strategies, keeping healthy foods in the home and planning for success.  Faith Galvan has agreed to follow-up with our clinic in 2 weeks.  Fasting for IC at next visit.  Arrive 30 minutes early.  She was informed of the importance of frequent follow-up visits to maximize her success with intensive lifestyle modifications for her multiple health conditions.   Objective:   Blood pressure 119/78, pulse 77, temperature 98 F (36.7 C), height 5\' 3"  (1.6 m), weight 182 lb (82.6 kg), SpO2 97 %. Body mass index is 32.24 kg/m.  General: Cooperative, alert, well developed, in no acute distress. HEENT: Conjunctivae and lids unremarkable. Cardiovascular: Regular rhythm.  Lungs: Normal work of breathing. Neurologic: No focal deficits.   Lab Results  Component Value Date   CREATININE 0.77 09/03/2019   BUN 18 09/03/2019   NA 138 09/03/2019   K 4.4 09/03/2019   CL 107 09/03/2019   CO2 21 (L) 09/03/2019   Lab Results  Component Value Date   ALT 25 09/22/2018   AST 15 09/22/2018   ALKPHOS  89 09/22/2018   BILITOT 0.3 09/22/2018   Lab Results  Component Value Date   HGBA1C 5.8 (H) 02/11/2021   Lab Results  Component Value Date   INSULIN 12.0 02/11/2021   Lab Results  Component Value Date   TSH 1.430 09/22/2018   Lab Results  Component Value Date   CHOL 247 (H) 09/22/2018   HDL 80 09/22/2018   LDLCALC 134 (H) 09/22/2018   TRIG 163 (H) 09/22/2018    CHOLHDL 3.1 09/22/2018   Lab Results  Component Value Date   WBC 6.1 09/03/2019   HGB 13.4 09/03/2019   HCT 40.4 09/03/2019   MCV 103.6 (H) 09/03/2019   PLT 258 09/03/2019   Obesity Behavioral Intervention:   Approximately 15 minutes were spent on the discussion below.  ASK: We discussed the diagnosis of obesity with Faith Galvan today and Faith Galvan agreed to give [place] permission to discuss obesity behavioral modification therapy today.  ASSESS: Faith Galvan has the diagnosis of obesity and her BMI today is 32.4. Faith Galvan is in the action stage of change.   ADVISE: Faith Galvan was educated on the multiple health risks of obesity as well as the benefit of weight loss to improve her health. She was advised of the need for long term treatment and the importance of lifestyle modifications to improve her current health and to decrease her risk of future health problems.  AGREE: Multiple dietary modification options and treatment options were discussed and Faith Galvan agreed to follow the recommendations documented in the above note.  ARRANGE: Faith Galvan was educated on the importance of frequent visits to treat obesity as outlined per CMS and USPSTF guidelines and agreed to schedule her next follow up appointment today.  Attestation Statements:   Reviewed by clinician on day of visit: allergies, medications, problem list, medical history, surgical history, family history, social history, and previous encounter notes.  I, Water quality scientist, CMA, am acting as Location manager for [employer], DO  I have reviewed the above documentation for accuracy and completeness, and I agree with the above. Jearld Lesch, DO

## 2021-05-01 ENCOUNTER — Ambulatory Visit: Payer: 59

## 2021-05-06 ENCOUNTER — Ambulatory Visit (INDEPENDENT_AMBULATORY_CARE_PROVIDER_SITE_OTHER): Payer: Medicare Other | Admitting: Family Medicine

## 2021-05-06 ENCOUNTER — Encounter (INDEPENDENT_AMBULATORY_CARE_PROVIDER_SITE_OTHER): Payer: Self-pay | Admitting: Family Medicine

## 2021-05-06 ENCOUNTER — Other Ambulatory Visit (HOSPITAL_BASED_OUTPATIENT_CLINIC_OR_DEPARTMENT_OTHER): Payer: Medicare Other

## 2021-05-06 ENCOUNTER — Other Ambulatory Visit: Payer: Self-pay

## 2021-05-06 VITALS — BP 131/78 | HR 78 | Temp 97.8°F | Ht 63.0 in | Wt 181.0 lb

## 2021-05-06 DIAGNOSIS — R0602 Shortness of breath: Secondary | ICD-10-CM

## 2021-05-06 DIAGNOSIS — Z6833 Body mass index (BMI) 33.0-33.9, adult: Secondary | ICD-10-CM | POA: Diagnosis not present

## 2021-05-06 DIAGNOSIS — E559 Vitamin D deficiency, unspecified: Secondary | ICD-10-CM

## 2021-05-06 DIAGNOSIS — E669 Obesity, unspecified: Secondary | ICD-10-CM

## 2021-05-06 DIAGNOSIS — F5089 Other specified eating disorder: Secondary | ICD-10-CM

## 2021-05-06 DIAGNOSIS — R7303 Prediabetes: Secondary | ICD-10-CM | POA: Diagnosis not present

## 2021-05-06 MED ORDER — METFORMIN HCL 500 MG PO TABS: 500.0000 mg | ORAL_TABLET | Freq: Every day | ORAL | 0 refills | Status: DC

## 2021-05-07 LAB — VITAMIN D 25 HYDROXY (VIT D DEFICIENCY, FRACTURES): Vit D, 25-Hydroxy: 50.7 ng/mL (ref 30.0–100.0)

## 2021-05-12 ENCOUNTER — Ambulatory Visit (INDEPENDENT_AMBULATORY_CARE_PROVIDER_SITE_OTHER): Payer: Medicare Other | Admitting: Orthopaedic Surgery

## 2021-05-12 ENCOUNTER — Other Ambulatory Visit: Payer: Self-pay

## 2021-05-12 ENCOUNTER — Ambulatory Visit (INDEPENDENT_AMBULATORY_CARE_PROVIDER_SITE_OTHER): Payer: Medicare Other

## 2021-05-12 DIAGNOSIS — G8929 Other chronic pain: Secondary | ICD-10-CM

## 2021-05-12 DIAGNOSIS — M25562 Pain in left knee: Secondary | ICD-10-CM

## 2021-05-12 MED ORDER — LIDOCAINE HCL 1 % IJ SOLN
3.0000 mL | INTRAMUSCULAR | Status: AC | PRN
Start: 2021-05-12 — End: 2021-05-12
  Administered 2021-05-12: 3 mL

## 2021-05-12 MED ORDER — METHYLPREDNISOLONE ACETATE 40 MG/ML IJ SUSP
40.0000 mg | INTRAMUSCULAR | Status: AC | PRN
  Administered 2021-05-12: 40 mg via INTRA_ARTICULAR

## 2021-05-12 NOTE — Progress Notes (Signed)
Office Visit Note   Patient: Faith Galvan           Date of Birth: Nov 02, 1956           MRN: [id number] Visit Date: 05/12/2021              Requested by: Leia Alf, PA-C [address] [place],  [place] [address] PCP: Leia Alf, [place]   Assessment & Plan: Visit Diagnoses:  1. Chronic pain of left knee     Plan: Having had steroid injections in the past that have helped her significantly with her hip, she was interested in having this for her left knee and I agree with this as well.  I explained the risk and benefits of steroid injections and she tolerated it well in her left knee.  All questions and concerns were answered and addressed.  I talked to her about not doing her mopping a floor cleaning on her knees and to avoid exercise walks to go up and down hills.  All questions and concerns were answered and addressed.  Follow-up can be as needed.  If she still has knee issues after a month, she can let us know because this knee would warrant an MRI.  She does feel like the knee sleeve helps her and I agree with her wearing a knee sleeve.  Follow-Up Instructions: Return if symptoms worsen or fail to improve.   Orders:  Orders Placed This Encounter  Procedures   Large Joint Inj   XR Knee 1-2 Views Left   No orders of the defined types were placed in this encounter.     Procedures: Large Joint Inj: L knee on 05/12/2021 9:05 AM Indications: diagnostic evaluation and pain Details: 22 G 1.5 in needle, superolateral approach  Arthrogram: No  Medications: 3 mL lidocaine 1 %; 40 mg methylPREDNISolone acetate 40 MG/ML Outcome: tolerated well, no immediate complications Procedure, treatment alternatives, risks and benefits explained, specific risks discussed. Consent was given by the patient. Immediately prior to procedure a time out was called to verify the correct patient, procedure, equipment, support staff and site/side marked as required. Patient  was prepped and draped in the usual sterile fashion.      Clinical Data: No additional findings.   Subjective: Chief Complaint  Patient presents with   Left Hip - Follow-up  The patient is very active 65 year old female well-known to me.  About 6 weeks ago we saw her and she is having right hip pain.  The seem to be intra-articular in nature.  Her x-rays were negative and she did have an intra-articular injection with steroid under fluoroscopy by Dr. Ernestina Patches.  She says that right hip injection is done great for her and now her left knee is bothering her.  She has had knee pain off and on for a long time and does wear knee sleeve.  She says is really bothering her now in terms of pain in that left knee and she points to the lateral aspect of that knee as a source of her pain.  She denies any locking catching or any injuries.  She has never had any type of injection in that left knee either.  HPI  Review of Systems She currently denies any headache, chest pain, shortness of breath, fever, chills, nausea, vomiting  Objective: Vital Signs: There were no vitals taken for this visit.  Physical Exam She is alert and orient x3 and in no acute distress Ortho Exam Examination  of her left knee shows no effusion.  There is lateral joint line tenderness but good range of motion of the left knee.  It is ligamentously stable.  Both hips are moving smoothly. Specialty Comments:  No specialty comments available.  Imaging: XR Knee 1-2 Views Left  Result Date: 05/12/2021 2 views of the left knee showed no acute findings.  The joint space medial and lateral is well-maintained.  There is a small lateral peritracheal osteophyte.  The alignment is neutral.  There is no effusion.    PMFS History: Patient Active Problem List   Diagnosis Date Noted   Vitamin D deficiency 03/16/2021   Prediabetes 02/12/2021   Hypertension 02/03/2021   H/O blood clots    S/P laparoscopic cholecystectomy 09/06/2019    Osteopenia 03/15/2018   Intolerant of heat 03/15/2018   Low back pain 03/15/2018   Menopausal symptom 03/15/2018   Urinary tract infectious disease 03/15/2018   Sprain of ankle 06/24/2016   Avulsion of right ankle 02/24/2016   Toe pain, left 08/26/2015   Loss of transverse plantar arch of left foot 08/26/2015   Hallux limitus of left foot 08/26/2015   Class 1 obesity due to excess calories with body mass index (BMI) of 32.0 to 32.9 in adult 08/31/2012   Abnormal Pap smear, atypical squamous cells of undetermined sign (ASC-US) 02/24/2012   Tinea corporis 08/28/2009   Disorder of bone and cartilage 10/01/2008   Allergic rhinitis 09/06/2008   Benign neoplasm of colon 09/06/2008   Personal history of colonic polyps 02/19/2008   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   Allergy PCN   Arthritis    H/O blood clots    32 months old - blood clots in right leg   Hx of adenomatous polyp of rectum 02/19/2008   Hypertension    Infertility, female    MVP (mitral valve prolapse)     Family History  Problem Relation Age of Onset   Diabetes Mother    Hypertension Mother    High Cholesterol Mother    Depression Mother    Anxiety disorder Mother    Obesity Mother    Diabetes Father    Hypertension Father    High Cholesterol Father    Depression Father    Anxiety disorder Father    Alcohol abuse Father    Obesity Father    Cancer Paternal Aunt        mouth cancer   Cancer Paternal Uncle        mouth cancer   Cancer Maternal Grandmother        mouth cancer   Heart disease Paternal Grandfather    Bladder Cancer Paternal Aunt 38           Colon cancer Cousin 60   Colon polyps Neg Hx    Esophageal cancer Neg Hx    Stomach cancer Neg Hx    Rectal cancer Neg Hx    Pancreatic cancer Neg Hx    Prostate cancer Neg Hx     Past Surgical History:  Procedure Laterality Date   CHOLECYSTECTOMY N/A 09/06/2019   Procedure: LAPAROSCOPIC  CHOLECYSTECTOMY, UMBILICIAL HERNIA REPAIR;  Surgeon: Coralie Keens, MD;  Location: [facility];  Service: General;  Laterality: N/A;   HYSTEROSCOPY     TONSILECTOMY/ADENOIDECTOMY WITH MYRINGOTOMY     Social History   Occupational History   Occupation: retired  Tobacco Use   Smoking status: Former  Pack years: 0.00    Types: Cigarettes    Quit date: 08/22/1994    Years since quitting: 26.7   Smokeless tobacco: Never  Vaping Use   Vaping Use: Never used  Substance and Sexual Activity   Alcohol use: Yes    Alcohol/week: 5.0 - 7.0 standard drinks    Types: 5 - 7 Standard drinks or equivalent per week   Drug use: No   Sexual activity: Not Currently    Birth control/protection: Post-menopausal

## 2021-05-13 NOTE — Progress Notes (Signed)
Chief Complaint:   OBESITY Faith Galvan is here to discuss her progress with her obesity treatment plan along with follow-up of her obesity related diagnoses.   Today's visit was #: 6 Starting weight: 186 lbs Starting date: 02/11/2021 Today's weight: 181 lbs Today's date: 05/06/2021 Weight change since last visit: 1 lb Total lbs lost to date: 5 lbs Body mass index is 32.06 kg/m.  Total weight loss percentage to date: -2.69%  Interim History: Faith Galvan is here for a follow up office visit.  We reviewed her meal plan and questions were answered.  Patient's food recall appears to be accurate and consistent with what is on plan when she is following it.   When eating on plan, her hunger and cravings are well controlled.    Current Meal Plan: the Category 1 Plan for 80% of the time.  Current Exercise Plan: Walking, stretching 2-7 times per week.  Assessment/Plan:   Orders Placed This Encounter  Procedures   VITAMIN D 25 Hydroxy (Vit-D Deficiency, Fractures)   Medications Discontinued During This Encounter  Medication Reason   metFORMIN (GLUCOPHAGE) 500 MG tablet Reorder   Meds ordered this encounter  Medications   metFORMIN (GLUCOPHAGE) 500 MG tablet    Sig: Take 1 tablet (500 mg total) by mouth daily with lunch.    Dispense:  30 tablet    Refill:  0   1. Prediabetes Not at goal. Goal is HgbA1c < 5.7.  Medication: metformin 500 mg daily.  She says that metformin is helping with sweets cravings.  A1c was 5.8 ~1-2 days ago.  Denies need for increased dose or adjustment.  Plan:  Continue metformin.  She will continue to focus on protein-rich, low simple carbohydrate foods. We reviewed the importance of hydration, regular exercise for stress reduction, and restorative sleep.   Lab Results  Component Value Date   HGBA1C 5.8 (H) 02/11/2021   Lab Results  Component Value Date   INSULIN 12.0 02/11/2021   - Refill metFORMIN (GLUCOPHAGE) 500 MG tablet; Take 1 tablet  (500 mg total) by mouth daily with lunch.  Dispense: 30 tablet; Refill: 0  2. Vitamin D deficiency Not at goal.  Faith Galvan is taking a daily multivitamin.  She was told her vitamin D was too high by her PCP at [facility] ~3-4 months ago.  She is due for a recheck.  There is already an order in from Dr. Sabra Heck.  Plan: Continue multivitamin.  Will check vitamin D level today.  New order was placed because previous order placed by Dr. Sabra Heck was unable to be used by our office.  Lab Results  Component Value Date   VD25OH 32.7 09/22/2018   - VITAMIN D 25 Hydroxy (Vit-D Deficiency, Fractures)  3. SOB (shortness of breath) on exertion New IC performed today with RMR of 1207.  4. Other disorder of eating, with emotional eating Not at goal. Medication: None.  Plan:  Increased stress with trying to sell house, etc.  She is doing more emotional eating than prior.    Plan:  We discussed adding Wellbutrin for her emotional eating, but she will focus on lifestyle strategies first and declines medication today. Behavior modification techniques were discussed today to help deal with emotional/non-hunger eating behaviors.  5. Obesity BMI today is 32  Course: Faith Galvan is currently in the action stage of change. As such, her goal is to continue with weight loss efforts.   Nutrition goals: She has agreed to the Category 1 Plan.  Exercise goals:  As is.  Behavioral modification strategies: keeping healthy foods in the home, avoiding temptations, and planning for success.  Faith Galvan has agreed to follow-up with our clinic in 2-3 weeks. She was informed of the importance of frequent follow-up visits to maximize her success with intensive lifestyle modifications for her multiple health conditions.   Faith Galvan was informed we would discuss her lab results at her next visit unless there is a critical issue that needs to be addressed sooner. Faith Galvan agreed to keep her next visit at the agreed upon time to discuss  these results.  Objective:   Blood pressure 131/78, pulse 78, temperature 97.8 F (36.6 C), height 5\' 3"  (1.6 m), weight 181 lb (82.1 kg), SpO2 97 %. Body mass index is 32.06 kg/m.  General: Cooperative, alert, well developed, in no acute distress. HEENT: Conjunctivae and lids unremarkable. Cardiovascular: Regular rhythm.  Lungs: Normal work of breathing. Neurologic: No focal deficits.   Lab Results  Component Value Date   CREATININE 0.77 09/03/2019   BUN 18 09/03/2019   NA 138 09/03/2019   K 4.4 09/03/2019   CL 107 09/03/2019   CO2 21 (L) 09/03/2019   Lab Results  Component Value Date   ALT 25 09/22/2018   AST 15 09/22/2018   ALKPHOS 89 09/22/2018   BILITOT 0.3 09/22/2018   Lab Results  Component Value Date   HGBA1C 5.8 (H) 02/11/2021   Lab Results  Component Value Date   INSULIN 12.0 02/11/2021   Lab Results  Component Value Date   TSH 1.430 09/22/2018   Lab Results  Component Value Date   CHOL 247 (H) 09/22/2018   HDL 80 09/22/2018   LDLCALC 134 (H) 09/22/2018   TRIG 163 (H) 09/22/2018   CHOLHDL 3.1 09/22/2018   Lab Results  Component Value Date   VD25OH 50.7 05/06/2021   VD25OH 32.7 09/22/2018   Lab Results  Component Value Date   WBC 6.1 09/03/2019   HGB 13.4 09/03/2019   HCT 40.4 09/03/2019   MCV 103.6 (H) 09/03/2019   PLT 258 09/03/2019   Obesity Behavioral Intervention:   Approximately 15 minutes were spent on the discussion below.  ASK: We discussed the diagnosis of obesity with Faith Galvan today and Faith Galvan agreed to give [place] permission to discuss obesity behavioral modification therapy today.  ASSESS: Faith Galvan has the diagnosis of obesity and her BMI today is 32.2. Faith Galvan is in the action stage of change.   ADVISE: Faith Galvan was educated on the multiple health risks of obesity as well as the benefit of weight loss to improve her health. She was advised of the need for long term treatment and the importance of lifestyle modifications to  improve her current health and to decrease her risk of future health problems.  AGREE: Multiple dietary modification options and treatment options were discussed and Faith Galvan agreed to follow the recommendations documented in the above note.  ARRANGE: Faith Galvan was educated on the importance of frequent visits to treat obesity as outlined per CMS and USPSTF guidelines and agreed to schedule her next follow up appointment today.  Attestation Statements:   Reviewed by clinician on day of visit: allergies, medications, problem list, medical history, surgical history, family history, social history, and previous encounter notes.  I, Water quality scientist, CMA, am acting as Location manager for [employer], DO.  I have reviewed the above documentation for accuracy and completeness, and I agree with the above. Faith Galvan, D.O.  The 21st Century Cures Act was  signed into law in 2016 which includes the topic of electronic health records.  This provides immediate access to information in MyChart.  This includes consultation notes, operative notes, office notes, lab results and pathology reports.  If you have any questions about what you read please let us know at your next visit so we can discuss your concerns and take corrective action if need be.  We are right here with you.

## 2021-05-20 ENCOUNTER — Ambulatory Visit (INDEPENDENT_AMBULATORY_CARE_PROVIDER_SITE_OTHER): Payer: Medicare Other | Admitting: Family Medicine

## 2021-05-20 ENCOUNTER — Other Ambulatory Visit: Payer: Self-pay

## 2021-05-20 ENCOUNTER — Encounter (INDEPENDENT_AMBULATORY_CARE_PROVIDER_SITE_OTHER): Payer: Self-pay | Admitting: Family Medicine

## 2021-05-20 VITALS — BP 129/69 | HR 79 | Temp 98.4°F | Ht 63.0 in | Wt 179.0 lb

## 2021-05-20 DIAGNOSIS — R7303 Prediabetes: Secondary | ICD-10-CM | POA: Diagnosis not present

## 2021-05-20 DIAGNOSIS — E669 Obesity, unspecified: Secondary | ICD-10-CM

## 2021-05-20 DIAGNOSIS — E559 Vitamin D deficiency, unspecified: Secondary | ICD-10-CM

## 2021-05-20 DIAGNOSIS — Z6832 Body mass index (BMI) 32.0-32.9, adult: Secondary | ICD-10-CM

## 2021-05-20 MED ORDER — VITAMIN D 125 MCG (5000 UT) PO CAPS: 3.0000 | ORAL_CAPSULE | ORAL | 0 refills | Status: AC

## 2021-05-26 ENCOUNTER — Encounter (INDEPENDENT_AMBULATORY_CARE_PROVIDER_SITE_OTHER): Payer: Self-pay | Admitting: Family Medicine

## 2021-05-26 NOTE — Progress Notes (Signed)
Chief Complaint:   OBESITY Faith Galvan is here to discuss her progress with her obesity treatment plan along with follow-up of her obesity related diagnoses. Faith Galvan is on the Category 1 Plan and states she is following her eating plan approximately 95% of the time. Faith Galvan states she is stretching and doing lawn care for 20-30 minutes 7 times per week.  Today's visit was #: 7 Starting weight: 186 lbs Starting date: 02/11/2021 Today's weight: 179 lbs Today's date: 05/20/2021 Total lbs lost to date: 7 lbs Total lbs lost since last in-office visit: 2 lbs  Interim History: Faith Galvan does not like chicken much and is very tired of eating it.  She wants more variety in her foods.  She feels she could do better with her water intake.  Subjective:   1. Vitamin D deficiency Vitamin D had been over replaced on 5000 IU per day (vit D>100).  Stopped supplement in March.  Lab Results  Component Value Date   VD25OH 50.7 05/06/2021   VD25OH 32.7 09/22/2018   2. Prediabetes On metformin 500 mg daily.  Tolerating well.  Last A1c was 5.8.  Lab Results  Component Value Date   HGBA1C 5.8 (H) 02/11/2021   Lab Results  Component Value Date   INSULIN 12.0 02/11/2021   Assessment/Plan:   1. Vitamin D deficiency Take 5,000 IU on Monday, Wednesday, and Friday (vitamin D3).  - Start Cholecalciferol (VITAMIN D) 125 MCG (5000 UT) CAPS; Take 3 capsules by mouth once a week.  Dispense: 30 capsule; Refill: 0  2. Prediabetes Continue metformin.  3. Obesity with current BMI of 31.72  Faith Galvan is currently in the action stage of change. As such, her goal is to continue with weight loss efforts. She has agreed to the Category 1 Plan or keeping a food journal and adhering to recommended goals of 1000-1100 calories and 70 grams of protein.   Handout given:  Protein Content of Food.  Exercise goals:  As is.  Behavioral modification strategies: increasing lean protein intake and meal planning and  cooking strategies.  Objective:   Blood pressure 129/69, pulse 79, temperature 98.4 F (36.9 C), height 5\' 3"  (1.6 m), weight 179 lb (81.2 kg), SpO2 96 %. Body mass index is 31.71 kg/m.  General: Cooperative, alert, well developed, in no acute distress. HEENT: Conjunctivae and lids unremarkable. Cardiovascular: Regular rhythm.  Lungs: Normal work of breathing. Neurologic: No focal deficits.   Lab Results  Component Value Date   CREATININE 0.77 09/03/2019   BUN 18 09/03/2019   NA 138 09/03/2019   K 4.4 09/03/2019   CL 107 09/03/2019   CO2 21 (L) 09/03/2019   Lab Results  Component Value Date   ALT 25 09/22/2018   AST 15 09/22/2018   ALKPHOS 89 09/22/2018   BILITOT 0.3 09/22/2018   Lab Results  Component Value Date   HGBA1C 5.8 (H) 02/11/2021   Lab Results  Component Value Date   INSULIN 12.0 02/11/2021   Lab Results  Component Value Date   TSH 1.430 09/22/2018   Lab Results  Component Value Date   CHOL 247 (H) 09/22/2018   HDL 80 09/22/2018   LDLCALC 134 (H) 09/22/2018   TRIG 163 (H) 09/22/2018   CHOLHDL 3.1 09/22/2018   Lab Results  Component Value Date   VD25OH 50.7 05/06/2021   VD25OH 32.7 09/22/2018   Lab Results  Component Value Date   WBC 6.1 09/03/2019   HGB 13.4 09/03/2019   HCT 40.4 09/03/2019  MCV 103.6 (H) 09/03/2019   PLT 258 09/03/2019   Obesity Behavioral Intervention:   Approximately 15 minutes were spent on the discussion below.  ASK: We discussed the diagnosis of obesity with Faith Galvan today and Faith Galvan agreed to give [place] permission to discuss obesity behavioral modification therapy today.  ASSESS: Faith Galvan has the diagnosis of obesity and her BMI today is 31.8. Faith Galvan is in the action stage of change.   ADVISE: Faith Galvan was educated on the multiple health risks of obesity as well as the benefit of weight loss to improve her health. She was advised of the need for long term treatment and the importance of lifestyle modifications  to improve her current health and to decrease her risk of future health problems.  AGREE: Multiple dietary modification options and treatment options were discussed and Faith Galvan agreed to follow the recommendations documented in the above note.  ARRANGE: Faith Galvan was educated on the importance of frequent visits to treat obesity as outlined per CMS and USPSTF guidelines and agreed to schedule her next follow up appointment today.  Attestation Statements:   Reviewed by clinician on day of visit: allergies, medications, problem list, medical history, surgical history, family history, social history, and previous encounter notes.  I, Water quality scientist, CMA, am acting as Location manager for [employer], [place].  I have reviewed the above documentation for accuracy and completeness, and I agree with the above. -  Georgianne Fick, FNP

## 2021-06-04 ENCOUNTER — Other Ambulatory Visit: Payer: Self-pay

## 2021-06-04 ENCOUNTER — Encounter (INDEPENDENT_AMBULATORY_CARE_PROVIDER_SITE_OTHER): Payer: Self-pay | Admitting: Family Medicine

## 2021-06-04 ENCOUNTER — Ambulatory Visit (INDEPENDENT_AMBULATORY_CARE_PROVIDER_SITE_OTHER): Payer: Medicare Other | Admitting: Family Medicine

## 2021-06-04 VITALS — BP 131/78 | HR 99 | Temp 98.2°F | Ht 63.0 in | Wt 179.0 lb

## 2021-06-04 DIAGNOSIS — R140 Abdominal distension (gaseous): Secondary | ICD-10-CM

## 2021-06-04 DIAGNOSIS — E669 Obesity, unspecified: Secondary | ICD-10-CM

## 2021-06-04 DIAGNOSIS — R7303 Prediabetes: Secondary | ICD-10-CM | POA: Diagnosis not present

## 2021-06-04 DIAGNOSIS — Z6832 Body mass index (BMI) 32.0-32.9, adult: Secondary | ICD-10-CM

## 2021-06-04 MED ORDER — METFORMIN HCL 500 MG PO TABS: 500.0000 mg | ORAL_TABLET | Freq: Every day | ORAL | 0 refills | Status: DC

## 2021-06-10 NOTE — Progress Notes (Signed)
Chief Complaint:   OBESITY Faith Galvan is here to discuss her progress with her obesity treatment plan along with follow-up of her obesity related diagnoses. Faith Galvan is on the Category 1 Plan and keeping a food journal and adhering to recommended goals of 1000-1100 calories and 70 grams of protein and states she is following her eating plan approximately 90% of the time. Faith Galvan states she is doing some exercise at the activity center for 45 minutes 2-3 times per week and stretching for 30-45 minutes 7 times per week.  Today's visit was #: 8 Starting weight: 186 lbs Starting date: 02/11/2021 Today's weight: 179 lbs Today's date: 06/10/2021 Total lbs lost to date: 7 lbs Total lbs lost since last in-office visit: 0  Interim History: Faith Galvan is tired of the food and very discouraged. Hunger is satisfied. She has lost 7 lbs over 3 months and feels this is too slow. She adhere to the plan very well. Her muscle mass has maintained. All weight lost has been fat per bioimpedance.   Subjective:   1. Abdominal bloating Faith Galvan notes bloating in the morning no matter what she eats. She avoids salt. She had GYN visit in March and it checked out fine. She denies flatulence.  2. Prediabetes Faith Galvan's last A1C level was 5.8. She denies polyphagia. She is on Metformin.  Lab Results  Component Value Date   HGBA1C 5.8 (H) 02/11/2021   Lab Results  Component Value Date   INSULIN 12.0 02/11/2021     Assessment/Plan:   1. Abdominal bloating Faith Galvan will continue to monitor. She will try drinking a protein shake in the morning vs egg or dairy.  2. Prediabetes  We will refill Metformin 500 mg for 1 month with not refills.  - metFORMIN (GLUCOPHAGE) 500 MG tablet; Take 1 tablet (500 mg total) by mouth daily with lunch.  Dispense: 30 tablet; Refill: 0  3. Obesity with current BMI of 31.72 Faith Galvan is currently in the action stage of change. As such, her goal is to continue with weight loss efforts.  She has agreed to following a lower carbohydrate, vegetable and lean protein rich diet plan.   Exercise goals:  As is.  Behavioral modification strategies: increasing lean protein intake and decreasing simple carbohydrates.  Faith Galvan has agreed to follow-up with our clinic in 2 weeks.  Objective:   Blood pressure 131/78, pulse 99, temperature 98.2 F (36.8 C), height '5\' 3"'$  (1.6 m), weight 179 lb (81.2 kg), SpO2 96 %. Body mass index is 31.71 kg/m.  General: Cooperative, alert, well developed, in no acute distress. HEENT: Conjunctivae and lids unremarkable. Cardiovascular: Regular rhythm.  Lungs: Normal work of breathing. Neurologic: No focal deficits.   Lab Results  Component Value Date   CREATININE 0.77 09/03/2019   BUN 18 09/03/2019   NA 138 09/03/2019   K 4.4 09/03/2019   CL 107 09/03/2019   CO2 21 (L) 09/03/2019   Lab Results  Component Value Date   ALT 25 09/22/2018   AST 15 09/22/2018   ALKPHOS 89 09/22/2018   BILITOT 0.3 09/22/2018   Lab Results  Component Value Date   HGBA1C 5.8 (H) 02/11/2021   Lab Results  Component Value Date   INSULIN 12.0 02/11/2021   Lab Results  Component Value Date   TSH 1.430 09/22/2018   Lab Results  Component Value Date   CHOL 247 (H) 09/22/2018   HDL 80 09/22/2018   LDLCALC 134 (H) 09/22/2018   TRIG 163 (H) 09/22/2018  CHOLHDL 3.1 09/22/2018   Lab Results  Component Value Date   VD25OH 50.7 05/06/2021   VD25OH 32.7 09/22/2018   Lab Results  Component Value Date   WBC 6.1 09/03/2019   HGB 13.4 09/03/2019   HCT 40.4 09/03/2019   MCV 103.6 (H) 09/03/2019   PLT 258 09/03/2019   No results found for: IRON, TIBC, FERRITIN  Obesity Behavioral Intervention:   Approximately 15 minutes were spent on the discussion below.  ASK: We discussed the diagnosis of obesity with Faith Galvan today and Faith Galvan agreed to give [place] permission to discuss obesity behavioral modification therapy today.  ASSESS: Ninel has the  diagnosis of obesity and her BMI today is 31.7. Faith Galvan is in the action stage of change.   ADVISE: Faith Galvan was educated on the multiple health risks of obesity as well as the benefit of weight loss to improve her health. She was advised of the need for long term treatment and the importance of lifestyle modifications to improve her current health and to decrease her risk of future health problems.  AGREE: Multiple dietary modification options and treatment options were discussed and Faith Galvan agreed to follow the recommendations documented in the above note.  ARRANGE: Faith Galvan was educated on the importance of frequent visits to treat obesity as outlined per CMS and USPSTF guidelines and agreed to schedule her next follow up appointment today.  Attestation Statements:   Reviewed by clinician on day of visit: allergies, medications, problem list, medical history, surgical history, family history, social history, and previous encounter notes.  I, Lizbeth Bark, RMA, am acting as Location manager for [employer], [place].   I have reviewed the above documentation for accuracy and completeness, and I agree with the above. -  Georgianne Fick, FNP

## 2021-06-11 ENCOUNTER — Encounter (INDEPENDENT_AMBULATORY_CARE_PROVIDER_SITE_OTHER): Payer: Self-pay | Admitting: Family Medicine

## 2021-06-18 ENCOUNTER — Ambulatory Visit (INDEPENDENT_AMBULATORY_CARE_PROVIDER_SITE_OTHER): Payer: Medicare Other | Admitting: Bariatrics

## 2021-06-18 ENCOUNTER — Encounter (INDEPENDENT_AMBULATORY_CARE_PROVIDER_SITE_OTHER): Payer: Self-pay | Admitting: Bariatrics

## 2021-06-18 ENCOUNTER — Other Ambulatory Visit: Payer: Self-pay

## 2021-06-18 VITALS — BP 131/70 | HR 98 | Temp 98.5°F | Ht 63.0 in | Wt 178.0 lb

## 2021-06-18 DIAGNOSIS — E559 Vitamin D deficiency, unspecified: Secondary | ICD-10-CM

## 2021-06-18 DIAGNOSIS — E669 Obesity, unspecified: Secondary | ICD-10-CM

## 2021-06-18 DIAGNOSIS — R7303 Prediabetes: Secondary | ICD-10-CM

## 2021-06-18 DIAGNOSIS — Z6832 Body mass index (BMI) 32.0-32.9, adult: Secondary | ICD-10-CM | POA: Diagnosis not present

## 2021-06-19 NOTE — Progress Notes (Signed)
Chief Complaint:   OBESITY Faith Galvan is here to discuss her progress with her obesity treatment plan along with follow-up of her obesity related diagnoses. Faith Galvan is on the Category 1 Plan and states she is following her eating plan approximately 85% of the time. Faith Galvan states she is doing aerobics 45 minutes 2 times per week.  Today's visit was #: 9 Starting weight: 186 lb Starting date: 02/11/2021 Today's weight: 178 lbs Today's date: 06/18/2021 Total lbs lost to date: 8 lbs Total lbs lost since last in-office visit: 1 lb  Interim History: Faith Galvan is down 1 additional ob since her last visit. She has been stressed some die to selling her house.  Subjective:   1. Vitamin D deficiency She is currently taking OTC vitamin D 125 MCG  each day. She denies nausea, vomiting or muscle weakness.  Lab Results  Component Value Date   VD25OH 50.7 05/06/2021   VD25OH 32.7 09/22/2018   2. Prediabetes Faith Galvan has a diagnosis of prediabetes based on her elevated HgA1c and was informed this puts her at greater risk of developing diabetes. She continues to work on diet and exercise to decrease her risk of diabetes. She denies nausea or hypoglycemia. She is currently taking Metformin.  Lab Results  Component Value Date   HGBA1C 5.8 (H) 02/11/2021   Lab Results  Component Value Date   INSULIN 12.0 02/11/2021   Assessment/Plan:   1. Vitamin D deficiency Low Vitamin D level contributes to fatigue and are associated with obesity, breast, and colon cancer. She agrees to continue to take\ing OTC Vitamin D '@5000'$  IU every week and will follow-up for routine testing of Vitamin D, at least 2-3 times per year to avoid over-replacement.  2. Prediabetes Faith Galvan will continue to work on weight loss, exercise, and decreasing simple carbohydrates to help decrease the risk of diabetes. She will continue taking her medication.  3. Obesity with current BMI of 31.7 Faith Galvan is currently in the action  stage of change. As such, her goal is to continue with weight loss efforts. She has agreed to the Category 1 Plan.   Exercise goals: As is.  Behavioral modification strategies: increasing lean protein intake, decreasing simple carbohydrates, increasing vegetables, increasing water intake, decreasing eating out, no skipping meals, meal planning and cooking strategies, keeping healthy foods in the home, and planning for success.  Faith Galvan has agreed to follow-up with our clinic in 2 weeks. She was informed of the importance of frequent follow-up visits to maximize her success with intensive lifestyle modifications for her multiple health conditions.   Objective:   Blood pressure 131/70, pulse 98, temperature 98.5 F (36.9 C), height '5\' 3"'$  (1.6 m), weight 178 lb (80.7 kg), SpO2 96 %. Body mass index is 31.53 kg/m.  General: Cooperative, alert, well developed, in no acute distress. HEENT: Conjunctivae and lids unremarkable. Cardiovascular: Regular rhythm.  Lungs: Normal work of breathing. Neurologic: No focal deficits.   Lab Results  Component Value Date   CREATININE 0.77 09/03/2019   BUN 18 09/03/2019   NA 138 09/03/2019   K 4.4 09/03/2019   CL 107 09/03/2019   CO2 21 (L) 09/03/2019   Lab Results  Component Value Date   ALT 25 09/22/2018   AST 15 09/22/2018   ALKPHOS 89 09/22/2018   BILITOT 0.3 09/22/2018   Lab Results  Component Value Date   HGBA1C 5.8 (H) 02/11/2021   Lab Results  Component Value Date   INSULIN 12.0 02/11/2021   Lab Results  Component Value Date   TSH 1.430 09/22/2018   Lab Results  Component Value Date   CHOL 247 (H) 09/22/2018   HDL 80 09/22/2018   LDLCALC 134 (H) 09/22/2018   TRIG 163 (H) 09/22/2018   CHOLHDL 3.1 09/22/2018   Lab Results  Component Value Date   VD25OH 50.7 05/06/2021   VD25OH 32.7 09/22/2018   Lab Results  Component Value Date   WBC 6.1 09/03/2019   HGB 13.4 09/03/2019   HCT 40.4 09/03/2019   MCV 103.6 (H)  09/03/2019   PLT 258 09/03/2019   No results found for: IRON, TIBC, FERRITIN  Obesity Behavioral Intervention:   Approximately 15 minutes were spent on the discussion below.  ASK: We discussed the diagnosis of obesity with Faith Galvan today and Faith Galvan agreed to give [place] permission to discuss obesity behavioral modification therapy today.  ASSESS: Faith Galvan has the diagnosis of obesity and her BMI today is 31.7. Faith Galvan is in the action stage of change.   ADVISE: Faith Galvan was educated on the multiple health risks of obesity as well as the benefit of weight loss to improve her health. She was advised of the need for long term treatment and the importance of lifestyle modifications to improve her current health and to decrease her risk of future health problems.  AGREE: Multiple dietary modification options and treatment options were discussed and Faith Galvan agreed to follow the recommendations documented in the above note.  ARRANGE: Faith Galvan was educated on the importance of frequent visits to treat obesity as outlined per CMS and USPSTF guidelines and agreed to schedule her next follow up appointment today.  Attestation Statements:   Reviewed by clinician on day of visit: allergies, medications, problem list, medical history, surgical history, family history, social history, and previous encounter notes.  Faith Galvan, CMA, am acting as transcriptionist for Dr. Jearld Lesch, DO.   I have reviewed the above documentation for accuracy and completeness, and I agree with the above. Jearld Lesch, DO

## 2021-06-22 ENCOUNTER — Encounter (INDEPENDENT_AMBULATORY_CARE_PROVIDER_SITE_OTHER): Payer: Self-pay | Admitting: Bariatrics

## 2021-07-02 ENCOUNTER — Ambulatory Visit (INDEPENDENT_AMBULATORY_CARE_PROVIDER_SITE_OTHER): Payer: Medicare Other | Admitting: Family Medicine

## 2021-07-06 ENCOUNTER — Encounter (INDEPENDENT_AMBULATORY_CARE_PROVIDER_SITE_OTHER): Payer: Self-pay | Admitting: Bariatrics

## 2021-07-06 ENCOUNTER — Other Ambulatory Visit: Payer: Self-pay

## 2021-07-06 ENCOUNTER — Ambulatory Visit (INDEPENDENT_AMBULATORY_CARE_PROVIDER_SITE_OTHER): Payer: Medicare Other | Admitting: Bariatrics

## 2021-07-06 VITALS — BP 123/82 | HR 66 | Temp 97.7°F | Ht 63.0 in | Wt 178.0 lb

## 2021-07-06 DIAGNOSIS — F5089 Other specified eating disorder: Secondary | ICD-10-CM

## 2021-07-06 DIAGNOSIS — R7303 Prediabetes: Secondary | ICD-10-CM | POA: Diagnosis not present

## 2021-07-06 DIAGNOSIS — E669 Obesity, unspecified: Secondary | ICD-10-CM | POA: Diagnosis not present

## 2021-07-06 DIAGNOSIS — I10 Essential (primary) hypertension: Secondary | ICD-10-CM | POA: Diagnosis not present

## 2021-07-06 DIAGNOSIS — E559 Vitamin D deficiency, unspecified: Secondary | ICD-10-CM

## 2021-07-06 DIAGNOSIS — Z6832 Body mass index (BMI) 32.0-32.9, adult: Secondary | ICD-10-CM

## 2021-07-06 MED ORDER — BUPROPION HCL ER (SR) 150 MG PO TB12: 150.0000 mg | ORAL_TABLET | Freq: Two times a day (BID) | ORAL | 0 refills | Status: DC

## 2021-07-06 MED ORDER — METFORMIN HCL 500 MG PO TABS: 500.0000 mg | ORAL_TABLET | Freq: Every day | ORAL | 0 refills | Status: AC

## 2021-07-06 NOTE — Progress Notes (Signed)
Chief Complaint:   OBESITY Faith Galvan is here to discuss her progress with her obesity treatment plan along with follow-up of her obesity related diagnoses. Faith Galvan is on the Category 1 Plan and states she is following her eating plan approximately 85% of the time. Faith Galvan states she is walking for 40 minutes 3 times per week.  Today's visit was #: 10 Starting weight: 186 lbs Starting date: 02/11/2021 Today's weight: 178 lbs Today's date:07/06/2021 Total lbs lost to date: 8 lbs Total lbs lost since last in-office visit: 0  Interim History: Faith Galvan is the same weight as her last visit. She is adhering to the plan well.  Subjective:   1. Prediabetes Faith Galvan is taking her medication as directed.  2. Essential hypertension Faith Galvan is currently taking Diovan - Hct. Her hypertension is controlled.  3. Other disorder of eating, with emotional eating  denies contraindications to Wellbutrin.    4. Vitamin D deficiency She is currently taking prescription vitamin D 50,000 IU each week. She denies nausea, vomiting or muscle weakness.   Assessment/Plan:   1. Prediabetes Faith Galvan will continue to work on weight loss, exercise, and decreasing simple carbohydrates to help decrease the risk of diabetes. We will refill Metformin 500 mg once daily with lunch for 1 month with no refills.  - metFORMIN (GLUCOPHAGE) 500 MG tablet; Take 1 tablet (500 mg total) by mouth daily with lunch.  Dispense: 30 tablet; Refill: 0 - Insulin, random - Hemoglobin A1c  2. Essential hypertension Faith Galvan is working on healthy weight loss and exercise to improve blood pressure control. We will watch for signs of hypotension as she continues her lifestyle modifications. Faith Galvan will continue medication.   - Lipid Panel With LDL/HDL Ratio - Comprehensive metabolic panel  3. Other disorder of eating, with emotional eating Behavior modification techniques were discussed today to help Faith Galvan deal with her  emotional/non-hunger eating behaviors.  We will refill Wellbutrin SR 150 mg once daily in am with 1 month supply with no refills. Orders and follow up as documented in patient record.    - buPROPion (WELLBUTRIN SR) 150 MG 12 hr tablet; Take 1 tablet (150 mg total) by mouth 2 (two) times daily.  Dispense: 30 tablet; Refill: 0  4. Vitamin D deficiency Low Vitamin D level contributes to fatigue and are associated with obesity, breast, and colon cancer. We will refill prescription Vitamin D 50,000 IU every week for 1 month with no refills and [place] will follow-up for routine testing of Vitamin D, at least 2-3 times per year to avoid over-replacement.  - VITAMIN D 25 Hydroxy (Vit-D Deficiency, Fractures)  5. Obesity with current BMI of 31.6 Cacee is currently in the action stage of change. As such, her goal is to continue with weight loss efforts. She has agreed to the Category 1 Plan.   Samrah will continue meal planning. She will continue intentional eating.  Exercise goals:  Faith Galvan is walking and doing well.  Behavioral modification strategies: increasing lean protein intake, decreasing simple carbohydrates, increasing vegetables, increasing water intake, decreasing eating out, no skipping meals, meal planning and cooking strategies, keeping healthy foods in the home, and planning for success.  Faith Galvan has agreed to follow-up with our clinic in 3-4 weeks. She was informed of the importance of frequent follow-up visits to maximize her success with intensive lifestyle modifications for her multiple health conditions.   Faith Galvan was informed we would discuss her lab results at her next visit unless there is a critical issue that  needs to be addressed sooner. Faith Galvan agreed to keep her next visit at the agreed upon time to discuss these results.  Objective:   Blood pressure 123/82, pulse 66, temperature 97.7 F (36.5 C), height '5\' 3"'$  (1.6 m), weight 178 lb (80.7 kg), SpO2 99 %. Body mass  index is 31.53 kg/m.  General: Cooperative, alert, well developed, in no acute distress. HEENT: Conjunctivae and lids unremarkable. Cardiovascular: Regular rhythm.  Lungs: Normal work of breathing. Neurologic: No focal deficits.   Lab Results  Component Value Date   CREATININE 0.77 09/03/2019   BUN 18 09/03/2019   NA 138 09/03/2019   K 4.4 09/03/2019   CL 107 09/03/2019   CO2 21 (L) 09/03/2019   Lab Results  Component Value Date   ALT 25 09/22/2018   AST 15 09/22/2018   ALKPHOS 89 09/22/2018   BILITOT 0.3 09/22/2018   Lab Results  Component Value Date   HGBA1C 5.8 (H) 02/11/2021   Lab Results  Component Value Date   INSULIN 12.0 02/11/2021   Lab Results  Component Value Date   TSH 1.430 09/22/2018   Lab Results  Component Value Date   CHOL 247 (H) 09/22/2018   HDL 80 09/22/2018   LDLCALC 134 (H) 09/22/2018   TRIG 163 (H) 09/22/2018   CHOLHDL 3.1 09/22/2018   Lab Results  Component Value Date   VD25OH 50.7 05/06/2021   VD25OH 32.7 09/22/2018   Lab Results  Component Value Date   WBC 6.1 09/03/2019   HGB 13.4 09/03/2019   HCT 40.4 09/03/2019   MCV 103.6 (H) 09/03/2019   PLT 258 09/03/2019   No results found for: IRON, TIBC, FERRITIN  Obesity Behavioral Intervention:   Approximately 15 minutes were spent on the discussion below.  ASK: We discussed the diagnosis of obesity with Faith Galvan today and Faith Galvan agreed to give [place] permission to discuss obesity behavioral modification therapy today.  ASSESS: Faith Galvan has the diagnosis of obesity and her BMI today is 31.6. Faith Galvan is in the action stage of change.   ADVISE: Faith Galvan was educated on the multiple health risks of obesity as well as the benefit of weight loss to improve her health. She was advised of the need for long term treatment and the importance of lifestyle modifications to improve her current health and to decrease her risk of future health problems.  AGREE: Multiple dietary modification  options and treatment options were discussed and Faith Galvan agreed to follow the recommendations documented in the above note.  ARRANGE: Faith Galvan was educated on the importance of frequent visits to treat obesity as outlined per CMS and USPSTF guidelines and agreed to schedule her next follow up appointment today.  Attestation Statements:   Reviewed by clinician on day of visit: allergies, medications, problem list, medical history, surgical history, family history, social history, and previous encounter notes.  I, Lizbeth Bark, RMA, am acting as Location manager for [employer], DO.   I have reviewed the above documentation for accuracy and completeness, and I agree with the above. Jearld Lesch, DO

## 2021-07-07 LAB — LIPID PANEL WITH LDL/HDL RATIO
Cholesterol, Total: 254 mg/dL — ABNORMAL HIGH (ref 100–199)
HDL: 73 mg/dL (ref >39)
LDL Chol Calc (NIH): 164 mg/dL — ABNORMAL HIGH (ref 0–99)
LDL/HDL Ratio: 2.2 ratio (ref 0.0–3.2)
Triglycerides: 100 mg/dL (ref 0–149)
VLDL Cholesterol Cal: 17 mg/dL (ref 5–40)

## 2021-07-07 LAB — COMPREHENSIVE METABOLIC PANEL
ALT: 26 IU/L (ref 0–32)
AST: 15 IU/L (ref 0–40)
Albumin/Globulin Ratio: 2.5 — ABNORMAL HIGH (ref 1.2–2.2)
Albumin: 4.7 g/dL (ref 3.8–4.8)
Alkaline Phosphatase: 96 IU/L (ref 44–121)
BUN/Creatinine Ratio: 26 (ref 12–28)
BUN: 18 mg/dL (ref 8–27)
Bilirubin Total: 0.2 mg/dL (ref 0.0–1.2)
CO2: 25 mmol/L (ref 20–29)
Calcium: 9.9 mg/dL (ref 8.7–10.3)
Chloride: 103 mmol/L (ref 96–106)
Creatinine, Ser: 0.69 mg/dL (ref 0.57–1.00)
Globulin, Total: 1.9 g/dL (ref 1.5–4.5)
Glucose: 107 mg/dL — ABNORMAL HIGH (ref 65–99)
Potassium: 4.7 mmol/L (ref 3.5–5.2)
Sodium: 140 mmol/L (ref 134–144)
Total Protein: 6.6 g/dL (ref 6.0–8.5)
eGFR: 96 mL/min/{1.73_m2} (ref >59)

## 2021-07-07 LAB — HEMOGLOBIN A1C
Est. average glucose Bld gHb Est-mCnc: 114 mg/dL
Hgb A1c MFr Bld: 5.6 % (ref 4.8–5.6)

## 2021-07-07 LAB — INSULIN, RANDOM: INSULIN: 15.4 u[IU]/mL (ref 2.6–24.9)

## 2021-07-07 LAB — VITAMIN D 25 HYDROXY (VIT D DEFICIENCY, FRACTURES): Vit D, 25-Hydroxy: 54.6 ng/mL (ref 30.0–100.0)

## 2021-07-21 ENCOUNTER — Ambulatory Visit (INDEPENDENT_AMBULATORY_CARE_PROVIDER_SITE_OTHER): Payer: Medicare Other | Admitting: Bariatrics

## 2021-07-30 ENCOUNTER — Ambulatory Visit (INDEPENDENT_AMBULATORY_CARE_PROVIDER_SITE_OTHER): Payer: Medicare Other | Admitting: Bariatrics

## 2021-08-18 ENCOUNTER — Ambulatory Visit (HOSPITAL_BASED_OUTPATIENT_CLINIC_OR_DEPARTMENT_OTHER): Payer: Medicare Other

## 2021-08-18 ENCOUNTER — Ambulatory Visit (HOSPITAL_BASED_OUTPATIENT_CLINIC_OR_DEPARTMENT_OTHER): Payer: Medicare Other | Admitting: Radiology

## 2021-09-03 ENCOUNTER — Ambulatory Visit (HOSPITAL_BASED_OUTPATIENT_CLINIC_OR_DEPARTMENT_OTHER)
Admission: RE | Admit: 2021-09-03 | Discharge: 2021-09-03 | Disposition: A | Discharge status: Home / Self Care | Payer: Medicare Other | Source: Ambulatory Visit | Attending: Obstetrics & Gynecology | Admitting: Obstetrics & Gynecology

## 2021-09-03 ENCOUNTER — Other Ambulatory Visit: Payer: Self-pay

## 2021-09-03 DIAGNOSIS — Z1231 Encounter for screening mammogram for malignant neoplasm of breast: Secondary | ICD-10-CM

## 2021-09-03 DIAGNOSIS — E2839 Other primary ovarian failure: Secondary | ICD-10-CM | POA: Diagnosis present

## 2022-02-09 ENCOUNTER — Ambulatory Visit (HOSPITAL_BASED_OUTPATIENT_CLINIC_OR_DEPARTMENT_OTHER): Payer: Medicare Other | Admitting: Obstetrics & Gynecology

## 2022-02-10 ENCOUNTER — Ambulatory Visit (INDEPENDENT_AMBULATORY_CARE_PROVIDER_SITE_OTHER): Payer: Medicare Other | Admitting: Obstetrics & Gynecology

## 2022-02-10 DIAGNOSIS — Z87410 Personal history of cervical dysplasia: Secondary | ICD-10-CM

## 2022-02-10 DIAGNOSIS — Z9189 Other specified personal risk factors, not elsewhere classified: Secondary | ICD-10-CM

## 2022-02-10 DIAGNOSIS — M8580 Other specified disorders of bone density and structure, unspecified site: Secondary | ICD-10-CM | POA: Diagnosis not present

## 2022-02-10 NOTE — Progress Notes (Signed)
66 y.o. G42P0010 Widowed White or Caucasian female here for breast and pelvic exam.  Doing well.  Had episode of spotting at Thanksgiving.  Has not had anything since that time.  Discussed with pt importance of evaluation if happens again.  As has been 4 months with no other bleeding, do not feel needed at this time. ? ?Pt has osteopenia.  BMD ordered last year.  Will plan to repeat in another year or two depending on if anything changes from medical standpoint. ? ?No LMP recorded. Patient is postmenopausal.          ?Sexually active: No.  ?H/O STD:  h/o +HR HPV ? ?Health Maintenance: ?PCP:  Devona Konig, PA-C.  Has appt scheduled for June, 2023. ?Vaccines are up to date:  has not done shingrix vaccination ?Colonoscopy:  12/02/2017, follow up 5 years ?MMG:  09/03/2021 Negative ?BMD:  09/03/2021, -1.8 osteopenia ?Last pap smear:  02/03/2021 Negative   ?H/o abnormal pap smear:  2012, CIN1 ? ? reports that she quit smoking about 27 years ago. Her smoking use included cigarettes. She has never used smokeless tobacco. She reports current alcohol use of about 5.0 - 7.0 standard drinks per week. She reports that she does not use drugs. ? ?Past Medical History:  ?Diagnosis Date  ? Abnormal Pap smear   ? 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 Allergy PCN  ? Arthritis   ? H/O blood clots   ? 36 months old - blood clots in right leg  ? Hx of adenomatous polyp of rectum 02/19/2008  ? Hypertension   ? Infertility, female   ? MVP (mitral valve prolapse)   ? ? ?Past Surgical History:  ?Procedure Laterality Date  ? CHOLECYSTECTOMY N/A 09/06/2019  ? Procedure: LAPAROSCOPIC CHOLECYSTECTOMY, UMBILICIAL HERNIA REPAIR;  Surgeon: Coralie Keens, MD;  Location: [facility];  Service: General;  Laterality: N/A;  ? HYSTEROSCOPY    ? TONSILECTOMY/ADENOIDECTOMY WITH MYRINGOTOMY    ? ? ?Current Outpatient Medications  ?Medication Sig Dispense Refill  ? Boswellia Serrata (BOSWELLIA PO) Take 450 mg by mouth daily.     ? Cholecalciferol (VITAMIN D) 125 MCG (5000 UT)  CAPS Take 3 capsules by mouth once a week. 30 capsule 0  ? Cyanocobalamin (VITAMIN B-12 PO) Place 1 drop under the tongue daily. Vitamin B-12 Liquid    ? diclofenac sodium (VOLTAREN) 1 % GEL Apply 1 g topically 4 (four) times daily as needed (knee pain.).     ? hydrocortisone 2.5 % cream APPLY TO AFFECTED AREA TWICE A DAY    ? ibuprofen (ADVIL) 200 MG tablet Take 400 mg by mouth every 8 (eight) hours as needed (pain.).    ? metFORMIN (GLUCOPHAGE) 500 MG tablet Take 1 tablet (500 mg total) by mouth daily with lunch. 30 tablet 0  ? Multiple Vitamins-Minerals (AIRBORNE PO) Take 1 tablet by mouth 2 (two) times a week.     ? omeprazole (PRILOSEC) 20 MG capsule 1 capsule 30 minutes before morning meal    ? valsartan-hydrochlorothiazide (DIOVAN-HCT) 80-12.5 MG tablet Take 0.5 tablets by mouth daily.   2  ? vitamin k 100 MCG tablet 1 tablet    ? cetirizine (ZYRTEC) 10 MG tablet Take 10 mg by mouth daily. Aller-Tec (Patient not taking: Reported on 02/10/2022)    ? ?No current facility-administered medications for this visit.  ? ? ?Family History  ?Problem Relation Age of Onset  ? Diabetes Mother   ? Hypertension Mother   ? High Cholesterol Mother   ?  Depression Mother   ? Anxiety disorder Mother   ? Obesity Mother   ? Diabetes Father   ? Hypertension Father   ? High Cholesterol Father   ? Depression Father   ? Anxiety disorder Father   ? Alcohol abuse Father   ? Obesity Father   ? Cancer Paternal Aunt   ?     mouth cancer  ? Cancer Paternal Uncle   ?     mouth cancer  ? Cancer Maternal Grandmother   ?     mouth cancer  ? Heart disease Paternal Grandfather   ? Bladder Cancer Paternal Aunt 23  ?        ? Colon cancer Cousin 60  ? Colon polyps Neg Hx   ? Esophageal cancer Neg Hx   ? Stomach cancer Neg Hx   ? Rectal cancer Neg Hx   ? Pancreatic cancer Neg Hx   ? Prostate cancer Neg Hx   ? ? ?Review of Systems  ?All other systems reviewed and are negative. ? ?Exam:  ?General appearance: alert, cooperative and appears stated  age ?Breasts: normal appearance, no masses or tenderness ?Abdomen: soft, non-tender; bowel sounds normal; no masses,  no organomegaly ?Lymph nodes: Cervical, supraclavicular, and axillary nodes normal.  No abnormal inguinal nodes palpated ?Neurologic: Grossly normal ? ?Pelvic: External genitalia:  no lesions ?             Urethra:  normal appearing urethra with no masses, tenderness or lesions ?             Bartholins and Skenes: normal    ?             Vagina: normal appearing vagina with atrophic changes and no discharge, no lesions ?             Cervix: no lesions ?             Pap taken: No. ?Bimanual Exam:  Uterus:  normal size, contour, position, consistency, mobility, non-tender ?             Adnexa: normal adnexa and no mass, fullness, tenderness ?              Rectovaginal: Confirms ?              Anus:  normal sphincter tone, no lesions ? ?Chaperone, Octaviano Batty, CMA, was present for exam. ? ?Assessment/Plan: ?1. GYN exam for high-risk Medicare patient ?- Pap smear neg 01/2021, neg HR HPV 2019.  Repeat HR HPV next year. ?- Mammogram 08/2021 ?- Colonoscopy 11/2017, follow up 5 years ?- Bone mineral density 08/2021 with osteopenia, t score -1.8 ?- lab work done done with PCP ?- vaccines reviewed/updated ? ?2. History of cervical dysplasia ?- 2012 ? ?3. Osteopenia, unspecified location ?  ?4.  PMP bleeding ?- pt very aware to call with any new bleeding as evaluated would be initiated ? ? ?

## 2022-02-13 ENCOUNTER — Encounter (HOSPITAL_BASED_OUTPATIENT_CLINIC_OR_DEPARTMENT_OTHER): Payer: Self-pay | Admitting: Obstetrics & Gynecology

## 2022-03-23 ENCOUNTER — Telehealth: Payer: Self-pay | Admitting: Orthopaedic Surgery

## 2022-03-23 NOTE — Telephone Encounter (Signed)
Received call from patient. She has moved and needs records sent to Dr. Loanne Drilling in [place] for appt 5/18. I emailed pt auth to complete & [email or web address] ?

## 2022-06-23 ENCOUNTER — Encounter (INDEPENDENT_AMBULATORY_CARE_PROVIDER_SITE_OTHER): Payer: Self-pay

## 2022-08-03 ENCOUNTER — Other Ambulatory Visit (HOSPITAL_BASED_OUTPATIENT_CLINIC_OR_DEPARTMENT_OTHER): Payer: Self-pay | Admitting: Obstetrics & Gynecology

## 2022-08-03 DIAGNOSIS — Z1231 Encounter for screening mammogram for malignant neoplasm of breast: Secondary | ICD-10-CM

## 2022-09-06 ENCOUNTER — Ambulatory Visit (HOSPITAL_BASED_OUTPATIENT_CLINIC_OR_DEPARTMENT_OTHER)
Admission: RE | Admit: 2022-09-06 | Discharge: 2022-09-06 | Disposition: A | Discharge status: Home / Self Care | Payer: Medicare Other | Source: Ambulatory Visit | Attending: Obstetrics & Gynecology | Admitting: Obstetrics & Gynecology

## 2022-09-06 DIAGNOSIS — Z1231 Encounter for screening mammogram for malignant neoplasm of breast: Secondary | ICD-10-CM | POA: Diagnosis present

## 2023-01-13 ENCOUNTER — Encounter: Payer: Self-pay | Admitting: Internal Medicine

## 2023-02-17 ENCOUNTER — Ambulatory Visit (HOSPITAL_BASED_OUTPATIENT_CLINIC_OR_DEPARTMENT_OTHER): Payer: Medicare Other | Admitting: Obstetrics & Gynecology

## 2023-03-28 ENCOUNTER — Ambulatory Visit (INDEPENDENT_AMBULATORY_CARE_PROVIDER_SITE_OTHER): Payer: Medicare Other | Admitting: Obstetrics & Gynecology

## 2023-03-28 ENCOUNTER — Encounter (HOSPITAL_BASED_OUTPATIENT_CLINIC_OR_DEPARTMENT_OTHER): Payer: Self-pay | Admitting: Obstetrics & Gynecology

## 2023-03-28 ENCOUNTER — Other Ambulatory Visit (HOSPITAL_COMMUNITY)
Admission: RE | Admit: 2023-03-28 | Discharge: 2023-03-28 | Disposition: A | Discharge status: Home / Self Care | Payer: Medicare Other | Source: Ambulatory Visit | Attending: Obstetrics & Gynecology | Admitting: Obstetrics & Gynecology

## 2023-03-28 VITALS — BP 127/75 | HR 76 | Ht 62.5 in | Wt 189.6 lb

## 2023-03-28 DIAGNOSIS — Z8619 Personal history of other infectious and parasitic diseases: Secondary | ICD-10-CM

## 2023-03-28 DIAGNOSIS — Z1231 Encounter for screening mammogram for malignant neoplasm of breast: Secondary | ICD-10-CM | POA: Diagnosis not present

## 2023-03-28 DIAGNOSIS — M85852 Other specified disorders of bone density and structure, left thigh: Secondary | ICD-10-CM

## 2023-03-28 DIAGNOSIS — Z9189 Other specified personal risk factors, not elsewhere classified: Secondary | ICD-10-CM | POA: Diagnosis not present

## 2023-03-28 DIAGNOSIS — Z124 Encounter for screening for malignant neoplasm of cervix: Secondary | ICD-10-CM

## 2023-03-28 DIAGNOSIS — M85851 Other specified disorders of bone density and structure, right thigh: Secondary | ICD-10-CM | POA: Diagnosis not present

## 2023-03-28 DIAGNOSIS — R21 Rash and other nonspecific skin eruption: Secondary | ICD-10-CM

## 2023-03-28 MED ORDER — HYDROCORTISONE 2.5 % EX CREA
TOPICAL_CREAM | CUTANEOUS | 1 refills | Status: AC
Start: 2023-03-28 — End: ?

## 2023-03-28 MED ORDER — KETOCONAZOLE 2 % EX CREA
1.0000 | TOPICAL_CREAM | Freq: Every day | CUTANEOUS | 0 refills | Status: DC
Start: 2023-03-28 — End: 2023-05-02

## 2023-03-28 NOTE — Patient Instructions (Signed)
Call [phone] to schedule an appointment at [facility].    Mammograms can also be self-scheduled online through MyChart.  

## 2023-03-28 NOTE — Progress Notes (Unsigned)
67 y.o. G3P0010 Widowed White or Caucasian female here for breast and pelvic exam.  Having some issues with rash in between her breasts.  Has used hydrocortisone cream and ketoconazole cream and this really helps.  Would like RFs.  Denies vaginal bleeding.  Broke right wrist.  Out of cast but in splint.  Has three more weeks and then will start PT.  Followed by Dr. Mitchell Heir.    H/o osteopenia.  Last BMD 2022.  Discussed with pt today.  She would like to repeat in October with MMG.  Order placed.    No LMP recorded. Patient is postmenopausal.          Sexually active: No.  H/O STD:  h/o +HR HPV  Health Maintenance: PCP:  Raj Janus, MD.  Last wellness appt was 09/2022.  Did blood work at that appt:  yes Vaccines are up to date:  has not done shingrix Colonoscopy:  scheduled Monday in [place] MMG:  09/08/2022 BMD:  09/03/2021 Last pap smear:  01/2021.     reports that she quit smoking about 28 years ago. Her smoking use included cigarettes. She has never used smokeless tobacco. She reports current alcohol use of about 5.0 - 7.0 standard drinks of alcohol per week. She reports that she does not use drugs.  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   Allergy PCN   Arthritis    H/O blood clots    44 months old - blood clots in right leg   Hx of adenomatous polyp of rectum 02/19/2008   Hypertension    Infertility, female    MVP (mitral valve prolapse)     Past Surgical History:  Procedure Laterality Date   CHOLECYSTECTOMY N/A 09/06/2019   Procedure: LAPAROSCOPIC CHOLECYSTECTOMY, UMBILICIAL HERNIA REPAIR;  Surgeon: Abigail Miyamoto, MD;  Location: [facility];  Service: General;  Laterality: N/A;   HYSTEROSCOPY     TONSILECTOMY/ADENOIDECTOMY WITH MYRINGOTOMY      Current Outpatient Medications  Medication Sig Dispense Refill   Boswellia Serrata (BOSWELLIA PO) Take 450 mg by mouth daily.      cetirizine (ZYRTEC) 10 MG tablet Take 10 mg by  mouth daily. Aller-Tec     Cholecalciferol (VITAMIN D) 125 MCG (5000 UT) CAPS Take 3 capsules by mouth once a week. 30 capsule 0   Cyanocobalamin (VITAMIN B-12 PO) Place 1 drop under the tongue daily. Vitamin B-12 Liquid     diclofenac sodium (VOLTAREN) 1 % GEL Apply 1 g topically 4 (four) times daily as needed (knee pain.).      ibuprofen (ADVIL) 200 MG tablet Take 400 mg by mouth every 8 (eight) hours as needed (pain.).     ketoconazole (NIZORAL) 2 % cream Apply 1 Application topically daily. Do not use for more than 5 days. 15 g 0   metFORMIN (GLUCOPHAGE) 500 MG tablet Take 1 tablet (500 mg total) by mouth daily with lunch. 30 tablet 0   Multiple Vitamins-Minerals (AIRBORNE PO) Take 1 tablet by mouth 2 (two) times a week.      omeprazole (PRILOSEC) 20 MG capsule 1 capsule 30 minutes before morning meal     valsartan-hydrochlorothiazide (DIOVAN-HCT) 80-12.5 MG tablet Take 0.5 tablets by mouth daily.   2   vitamin k 100 MCG tablet 1 tablet     hydrocortisone 2.5 % cream APPLY TO AFFECTED AREA TWICE A DAY.  Do not use for more than 7 days in a row.  30 g 1   No current facility-administered medications for this visit.    Family History  Problem Relation Age of Onset   Diabetes Mother    Hypertension Mother    High Cholesterol Mother    Depression Mother    Anxiety disorder Mother    Obesity Mother    Diabetes Father    Hypertension Father    High Cholesterol Father    Depression Father    Anxiety disorder Father    Alcohol abuse Father    Obesity Father    Cancer Paternal Aunt        mouth cancer   Cancer Paternal Uncle        mouth cancer   Cancer Maternal Grandmother        mouth cancer   Heart disease Paternal Grandfather    Bladder Cancer Paternal Aunt 52           Colon cancer Cousin 71   Colon polyps Neg Hx    Esophageal cancer Neg Hx    Stomach cancer Neg Hx    Rectal cancer Neg Hx    Pancreatic cancer Neg Hx    Prostate cancer Neg Hx     Review of Systems   Constitutional: Negative.   Genitourinary: Negative.     Exam:   BP 127/75 (BP Location: Left Arm, Patient Position: Sitting, Cuff Size: Large)   Pulse 76   Ht 5' 2.5" (1.588 m)   Wt 189 lb 9.6 oz (86 kg)   BMI 34.13 kg/m   Height: 5' 2.5" (158.8 cm)  General appearance: alert, cooperative and appears stated age Breasts: normal appearance, no masses or tenderness Abdomen: soft, non-tender; bowel sounds normal; no masses,  no organomegaly Lymph nodes: Cervical, supraclavicular, and axillary nodes normal.  No abnormal inguinal nodes palpated Neurologic: Grossly normal  Pelvic: External genitalia:  no lesions              Urethra:  normal appearing urethra with no masses, tenderness or lesions              Bartholins and Skenes: normal                 Vagina: normal appearing vagina with atrophic changes and no discharge, no lesions              Cervix: no lesions              Pap taken: Yes.   Bimanual Exam:  Uterus:  normal size, contour, position, consistency, mobility, non-tender              Adnexa: no mass, fullness, tenderness               Rectovaginal: Confirms               Anus:  normal sphincter tone, no lesions  Chaperone, Ina Homes, CMA, was present for exam.  Assessment/Plan: 1. GYN exam for high-risk Medicare patient - Pap smear obtained today - Mammogram ordered - Colonoscopy scheduled for Monday in [place] - Bone mineral density ordered - lab work done with PCP, Dr. Raj Janus (cannot find her in [facility] so added Dr. Joycie [facility] who is also in same office for communications) - vaccines reviewed/updated  2. Osteopenia of both hips - DG BONE DENSITY (DXA); Future  3. Encounter for screening mammogram for malignant neoplasm of breast - MM 3D SCREENING MAMMOGRAM BILATERAL BREAST; Future  4. History of HPV infection  5. Cervical cancer screening -  Cytology - PAP( ) - PR OBTAINING SCREEN PAP SMEAR  6. Skin rash - hydrocortisone 2.5 % cream;  APPLY TO AFFECTED AREA TWICE A DAY.  Do not use for more than 7 days in a row.  Dispense: 30 g; Refill: 1 - ketoconazole (NIZORAL) 2 % cream; Apply 1 Application topically daily. Do not use for more than 5 days.  Dispense: 15 g; Refill: 0

## 2023-03-29 LAB — CYTOLOGY - PAP: Diagnosis: NEGATIVE

## 2023-04-28 ENCOUNTER — Encounter (HOSPITAL_BASED_OUTPATIENT_CLINIC_OR_DEPARTMENT_OTHER): Payer: Self-pay | Admitting: Obstetrics & Gynecology

## 2023-04-29 ENCOUNTER — Other Ambulatory Visit (HOSPITAL_BASED_OUTPATIENT_CLINIC_OR_DEPARTMENT_OTHER): Payer: Self-pay | Admitting: Obstetrics & Gynecology

## 2023-04-29 DIAGNOSIS — R21 Rash and other nonspecific skin eruption: Secondary | ICD-10-CM

## 2023-09-08 ENCOUNTER — Ambulatory Visit (HOSPITAL_BASED_OUTPATIENT_CLINIC_OR_DEPARTMENT_OTHER)
Admission: RE | Admit: 2023-09-08 | Discharge: 2023-09-08 | Disposition: A | Discharge status: Home / Self Care | Payer: Medicare Other | Source: Ambulatory Visit | Attending: Obstetrics & Gynecology | Admitting: Obstetrics & Gynecology

## 2023-09-08 DIAGNOSIS — M85852 Other specified disorders of bone density and structure, left thigh: Secondary | ICD-10-CM | POA: Diagnosis present

## 2023-09-08 DIAGNOSIS — Z1231 Encounter for screening mammogram for malignant neoplasm of breast: Secondary | ICD-10-CM | POA: Diagnosis present

## 2023-09-08 DIAGNOSIS — M85851 Other specified disorders of bone density and structure, right thigh: Secondary | ICD-10-CM | POA: Diagnosis present

## 2024-03-29 ENCOUNTER — Ambulatory Visit (HOSPITAL_BASED_OUTPATIENT_CLINIC_OR_DEPARTMENT_OTHER): Payer: Medicare Other | Admitting: Obstetrics & Gynecology

## 2024-05-29 ENCOUNTER — Encounter (HOSPITAL_BASED_OUTPATIENT_CLINIC_OR_DEPARTMENT_OTHER): Payer: Self-pay | Admitting: Obstetrics & Gynecology

## 2024-05-29 ENCOUNTER — Ambulatory Visit (HOSPITAL_BASED_OUTPATIENT_CLINIC_OR_DEPARTMENT_OTHER): Admitting: Obstetrics & Gynecology

## 2024-05-29 VITALS — BP 141/77 | HR 73 | Wt 173.1 lb

## 2024-05-29 DIAGNOSIS — Z01419 Encounter for gynecological examination (general) (routine) without abnormal findings: Secondary | ICD-10-CM

## 2024-05-29 DIAGNOSIS — N950 Postmenopausal bleeding: Secondary | ICD-10-CM | POA: Diagnosis not present

## 2024-05-29 DIAGNOSIS — M85851 Other specified disorders of bone density and structure, right thigh: Secondary | ICD-10-CM

## 2024-05-29 DIAGNOSIS — I10 Essential (primary) hypertension: Secondary | ICD-10-CM | POA: Diagnosis not present

## 2024-05-29 DIAGNOSIS — M85852 Other specified disorders of bone density and structure, left thigh: Secondary | ICD-10-CM

## 2024-05-29 DIAGNOSIS — Z86718 Personal history of other venous thrombosis and embolism: Secondary | ICD-10-CM | POA: Diagnosis not present

## 2024-05-29 NOTE — Progress Notes (Signed)
 Breast and Pelvic Exam Patient name: Faith Galvan MRN [id number]  Date of birth: 1956-06-02 Chief Complaint:   Gynecologic Exam (Has a scratch from cat that she would like to have evaluated, still spotting after menopause, hot flashes, random intermittent pain in the left ovary that comes quick amd leaves ) and Weight Management Screening (Has an allergic reaction to Zepbound, would like an alternative )  History of Present Illness:   Faith Galvan is a 68 y.o. G1P0010 Caucasian here for breast and pelvic exam.  She had new diagnosis of cataracts and has been told she needs a knee replacement.  She doesn't want to do that right now and is having gel injections right now.  This has helped some.  H/o vaginal spotting in Nov and Dec.  Had this about a year ago.  She just let me know this today.  Discussed doing pelvic ultrasound.    On metformin  and was on Zepbound but had skin reaction.  She has stopped this.  Has follow up next month.    Last pap 03/2023. Results were: negative.  Last mammogram: 08/2023. Results were: normal. Family h/o breast cancer: no Last colonoscopy: 04/04/2023. F/u 5 years.   DEXA:  08/2023.  T score -1.8     03/28/2023    9:52 AM 02/10/2022    2:11 PM 02/11/2021    8:01 AM 02/03/2021    8:57 AM  Depression screen PHQ 2/9  Decreased Interest 0 0 1 0  Down, Depressed, Hopeless 0 0 1 0  PHQ - 2 Score 0 0 2 0  Altered sleeping   1   Tired, decreased energy   2   Change in appetite   2   Feeling bad or failure about yourself    1   Trouble concentrating   0   Moving slowly or fidgety/restless   0   Suicidal thoughts   0   PHQ-9 Score   8   Difficult doing work/chores   Somewhat difficult       Review of Systems:   Pertinent items are noted in HPI Denies any urinary changes, bowel changes, pelvic pain. Pertinent History Reviewed:  Reviewed past medical,surgical, social and family history.  Reviewed problem list, medications and  allergies. Physical Assessment:   Vitals:   05/29/24 0925  BP: (!) 141/77  Pulse: 73  SpO2: 100%  Weight: 173 lb 1.6 oz (78.5 kg)  Body mass index is 31.16 kg/m.        Physical Examination:   General appearance - well appearing, and in no distress  Mental status - alert, oriented to person, place, and time  Psych:  She has a normal mood and affect  Skin - warm and dry, normal color, no suspicious lesions noted  Chest - effort normal, all lung fields clear to auscultation bilaterally  Heart - normal rate and regular rhythm  Neck:  midline trachea, no thyromegaly or nodules  Breasts - breasts appear normal, no suspicious masses, no skin or nipple changes or  axillary nodes  Abdomen - soft, nontender, nondistended, no masses or organomegaly  Pelvic - VULVA: normal appearing vulva with no masses, tenderness or lesions   VAGINA: normal appearing vagina with normal color and discharge, no lesions   CERVIX: normal appearing cervix without discharge or lesions, no CMT  Thin prep pap is not indicated today  UTERUS: uterus is felt to be normal size, shape, consistency and nontender   ADNEXA: No adnexal masses or  tenderness noted.  Rectal - normal rectal, good sphincter tone, no masses felt.  Extremities:  No swelling or varicosities noted  Chaperone present for exam  No results found for this or any previous visit (from the past 24 hours).  Assessment & Plan:  1. Encntr for gyn exam (general) (routine) w/o abn findings (Primary) - Pap smear neg 2024 - Mammogram 08/2023 - Colonoscopy 2024. Follow up 5 years - Bone mineral density 210/2024 - lab work done with PCP, Dr. Lizette Ned - vaccines reviewed/updated  2. Postmenopausal bleeding - US  PELVIS TRANSVAGINAL NON-OB (TV ONLY); Future  3. H/O blood clots - was when she was a child  4. Primary hypertension - on valsartan /HCTZ  5. Osteopenia of both hips - will repeat 1-2 years   Orders Placed This Encounter   Procedures   US  PELVIS TRANSVAGINAL NON-OB (TV ONLY)    Meds: No orders of the defined types were placed in this encounter.   Follow-up: Return for ultrasound, f/u 1-2 years for breast/pelvic exam.  Ronal GORMAN Pinal, MD 05/29/2024 10:17 AM

## 2024-06-27 ENCOUNTER — Other Ambulatory Visit (HOSPITAL_BASED_OUTPATIENT_CLINIC_OR_DEPARTMENT_OTHER)

## 2024-07-04 ENCOUNTER — Ambulatory Visit (INDEPENDENT_AMBULATORY_CARE_PROVIDER_SITE_OTHER)

## 2024-07-04 ENCOUNTER — Ambulatory Visit (INDEPENDENT_AMBULATORY_CARE_PROVIDER_SITE_OTHER): Admitting: Obstetrics & Gynecology

## 2024-07-04 ENCOUNTER — Other Ambulatory Visit

## 2024-07-04 ENCOUNTER — Encounter (HOSPITAL_BASED_OUTPATIENT_CLINIC_OR_DEPARTMENT_OTHER): Payer: Self-pay | Admitting: Obstetrics & Gynecology

## 2024-07-04 VITALS — BP 158/84 | HR 72 | Ht 62.5 in | Wt 172.0 lb

## 2024-07-04 DIAGNOSIS — N950 Postmenopausal bleeding: Secondary | ICD-10-CM

## 2024-07-04 NOTE — Progress Notes (Signed)
   Ultrasound f/u Patient name: Faith Galvan MRN [id number]  Date of birth: May 14, 1956 Chief Complaint:   Post-Menopausal Bleeding (US  today)  History of Present Illness:   Faith Galvan is a 68 y.o. [id number] Caucasian female being seen today for discussion of ultrasound findings.  Ultrasound done due to single episode of PMP bleeding.  Ultrasound showed normal uterus with thin endometrium measuring 1.1 mm.  There is no abnormal vascularity noted.  Ovaries are normal.  Endometrium is thin.  We discussed the patient calling me if she has us  again for me to evaluate and see if I can identify source of bleeding.  For now I do not think a biopsy or other evaluation is needed.  Last bleeding episode was several months ago.  No LMP recorded. Patient is postmenopausal.   Review of Systems:   Pertinent items are noted in HPI Denies any urinary or bowel changes or pelvic pain Pertinent History Reviewed:  Reviewed past medical,surgical, social and family history.  Reviewed problem list, medications and allergies. Physical Assessment:   Vitals:   07/04/24 1055  BP: (!) 158/84  Pulse: 72  SpO2: 100%  Weight: 172 lb (78 kg)  Height: 5' 2.5 (1.588 m)  Body mass index is 30.96 kg/m.        Physical Examination:   General appearance - well appearing, and in no distress  Mental status - alert, oriented to person, place, and time  Psych:  She has a normal mood and affect    Assessment & Plan:  1. Postmenopausal bleeding (Primary) - Ultrasound reassuring today.  Patient is going to call if she has this again and I will try to evaluate her soon as possible.  I do not recommend an endometrial biopsy today.  Questions answered.   Meds: No orders of the defined types were placed in this encounter.   Ronal GORMAN Pinal, MD 07/04/2024 11:49 AM GYNECOLOGY  VISIT

## 2024-07-25 ENCOUNTER — Other Ambulatory Visit (HOSPITAL_BASED_OUTPATIENT_CLINIC_OR_DEPARTMENT_OTHER): Payer: Self-pay | Admitting: Obstetrics & Gynecology

## 2024-07-25 DIAGNOSIS — Z139 Encounter for screening, unspecified: Secondary | ICD-10-CM

## 2024-09-13 ENCOUNTER — Encounter (HOSPITAL_BASED_OUTPATIENT_CLINIC_OR_DEPARTMENT_OTHER): Payer: Self-pay

## 2024-09-13 ENCOUNTER — Ambulatory Visit (HOSPITAL_BASED_OUTPATIENT_CLINIC_OR_DEPARTMENT_OTHER)
Admission: RE | Admit: 2024-09-13 | Discharge: 2024-09-13 | Disposition: A | Discharge status: Home / Self Care | Source: Ambulatory Visit | Attending: Obstetrics & Gynecology | Admitting: Obstetrics & Gynecology

## 2024-09-13 DIAGNOSIS — Z1231 Encounter for screening mammogram for malignant neoplasm of breast: Secondary | ICD-10-CM | POA: Insufficient documentation

## 2024-09-13 DIAGNOSIS — Z139 Encounter for screening, unspecified: Secondary | ICD-10-CM

## 2024-09-18 ENCOUNTER — Other Ambulatory Visit: Payer: Self-pay | Admitting: Obstetrics & Gynecology

## 2024-09-18 DIAGNOSIS — R928 Other abnormal and inconclusive findings on diagnostic imaging of breast: Secondary | ICD-10-CM

## 2024-10-02 ENCOUNTER — Encounter (HOSPITAL_BASED_OUTPATIENT_CLINIC_OR_DEPARTMENT_OTHER): Payer: Self-pay | Admitting: Obstetrics & Gynecology

## 2024-10-02 ENCOUNTER — Ambulatory Visit (HOSPITAL_BASED_OUTPATIENT_CLINIC_OR_DEPARTMENT_OTHER): Payer: Self-pay | Admitting: Obstetrics & Gynecology

## 2024-11-01 ENCOUNTER — Inpatient Hospital Stay
Admission: RE | Admit: 2024-11-01 | Discharge: 2024-11-01 | Attending: Obstetrics & Gynecology | Admitting: Obstetrics & Gynecology

## 2024-11-01 ENCOUNTER — Other Ambulatory Visit

## 2024-11-01 DIAGNOSIS — R928 Other abnormal and inconclusive findings on diagnostic imaging of breast: Secondary | ICD-10-CM

## 2024-11-19 ENCOUNTER — Encounter (HOSPITAL_BASED_OUTPATIENT_CLINIC_OR_DEPARTMENT_OTHER): Payer: Self-pay | Admitting: Obstetrics & Gynecology

## 2025-05-31 ENCOUNTER — Encounter (HOSPITAL_BASED_OUTPATIENT_CLINIC_OR_DEPARTMENT_OTHER): Admitting: Obstetrics & Gynecology
